# Patient Record
Sex: Female | Born: 1985 | Race: White | Hispanic: No | Marital: Single | Smoking: Current every day smoker
Health system: Southern US, Community
[De-identification: ages and names within clinical notes are randomized; demographics above are authoritative.]

## PROBLEM LIST (undated history)

## (undated) DIAGNOSIS — F32A Depression, unspecified: Secondary | ICD-10-CM

## (undated) DIAGNOSIS — I82409 Acute embolism and thrombosis of unspecified deep veins of unspecified lower extremity: Secondary | ICD-10-CM

## (undated) DIAGNOSIS — F329 Major depressive disorder, single episode, unspecified: Secondary | ICD-10-CM

## (undated) HISTORY — PX: OTHER SURGICAL HISTORY: SHX169

---

## 2017-12-22 ENCOUNTER — Encounter: Payer: Self-pay | Admitting: Emergency Medicine

## 2017-12-22 ENCOUNTER — Other Ambulatory Visit: Payer: Self-pay

## 2017-12-22 ENCOUNTER — Emergency Department
Admission: EM | Admit: 2017-12-22 | Discharge: 2017-12-22 | Disposition: A | Discharge status: Home / Self Care | Payer: Medicaid Other | Attending: Emergency Medicine | Admitting: Emergency Medicine

## 2017-12-22 DIAGNOSIS — K047 Periapical abscess without sinus: Secondary | ICD-10-CM | POA: Insufficient documentation

## 2017-12-22 DIAGNOSIS — F17210 Nicotine dependence, cigarettes, uncomplicated: Secondary | ICD-10-CM | POA: Insufficient documentation

## 2017-12-22 DIAGNOSIS — K029 Dental caries, unspecified: Secondary | ICD-10-CM | POA: Diagnosis not present

## 2017-12-22 DIAGNOSIS — K0889 Other specified disorders of teeth and supporting structures: Secondary | ICD-10-CM

## 2017-12-22 HISTORY — DX: Acute embolism and thrombosis of unspecified deep veins of unspecified lower extremity: I82.409

## 2017-12-22 MED ORDER — AMOXICILLIN 500 MG PO CAPS
1000.0000 mg | ORAL_CAPSULE | Freq: Three times a day (TID) | ORAL | Status: DC
  Administered 2017-12-22: 1000 mg via ORAL
  Filled 2017-12-22: qty 2

## 2017-12-22 MED ORDER — MAGIC MOUTHWASH W/LIDOCAINE: 5.0000 mL | Freq: Four times a day (QID) | ORAL | 0 refills | Status: DC

## 2017-12-22 MED ORDER — HYDROCODONE-ACETAMINOPHEN 5-325 MG PO TABS
1.0000 | ORAL_TABLET | Freq: Once | ORAL | Status: AC
  Administered 2017-12-22: 1 via ORAL
  Filled 2017-12-22: qty 1

## 2017-12-22 MED ORDER — AMOXICILLIN 875 MG PO TABS: 875.0000 mg | ORAL_TABLET | Freq: Two times a day (BID) | ORAL | 0 refills | Status: DC

## 2017-12-22 NOTE — ED Provider Notes (Signed)
[facility] Emergency Department Provider Note  ____________________________________________  Time seen: Approximately 11:20 PM  I have reviewed the triage vital signs and the nursing notes.   HISTORY  Chief Complaint Dental Pain    HPI Kerri Wood is a 32 y.o. female who presents the emergency department complaining of dental pain.  Patient reports that she has known bad dentition.  She reports that she has 1 tooth that has eroded into the gumline in the right lower dentition.  Patient is reporting dental pain from are erupting wisdom teeth, and for "teeth that have been draining pus for 4 months."  Patient denies difficulty breathing or swallowing, fevers or chills, nausea or vomiting.  Patient takes Suboxone on a regular basis and is also been supplementing with Tylenol, Motrin, aspirin.  No other complaints at this time.  Past Medical History:  Diagnosis Date  . DVT (deep venous thrombosis) (HCC)     There are no active problems to display for this patient.   History reviewed. No pertinent surgical history.  Prior to Admission medications   Medication Sig Start Date End Date Taking? Authorizing Provider  amoxicillin (AMOXIL) 875 MG tablet Take 1 tablet (875 mg total) by mouth 2 (two) times daily. 12/22/17   Eulalia Ellerman, Delorise Royals, PA-C  magic mouthwash w/lidocaine SOLN Take 5 mLs by mouth 4 (four) times daily. 12/22/17   Bretton Tandy, Delorise Royals, PA-C    Allergies Morphine and related  History reviewed. No pertinent family history.  Social History Social History   Tobacco Use  . Smoking status: Current Every Day Smoker    Packs/day: 0.50    Types: Cigarettes  . Smokeless tobacco: Never Used  Substance Use Topics  . Alcohol use: Yes  . Drug use: Never     Review of Systems  Constitutional: No fever/chills Eyes: No visual changes. No discharge ENT: Positive for multiple dental complaints Cardiovascular: no chest pain. Respiratory: no  cough. No SOB. Gastrointestinal: No abdominal pain.  No nausea, no vomiting.  No diarrhea.  No constipation. Musculoskeletal: Negative for musculoskeletal pain. Skin: Negative for rash, abrasions, lacerations, ecchymosis. Neurological: Negative for headaches, focal weakness or numbness. 10-point ROS otherwise negative.  ____________________________________________   PHYSICAL EXAM:  VITAL SIGNS: ED Triage Vitals  Enc Vitals Group     BP 12/22/17 2202 121/78     Pulse Rate 12/22/17 2202 94     Resp --      Temp 12/22/17 2202 98 F (36.7 C)     Temp Source 12/22/17 2202 Oral     SpO2 12/22/17 2202 99 %     Weight 12/22/17 2154 140 lb (63.5 kg)     Height 12/22/17 2154  (1.676 m)     Head Circumference --      Peak Flow --      Pain Score 12/22/17 2158 8     Pain Loc --      Pain Edu? --      Excl. in GC? --      Constitutional: Alert and oriented. Well appearing and in no acute distress. Eyes: Conjunctivae are normal. PERRL. EOMI. Head: Atraumatic. ENT:      Ears:       Nose: No congestion/rhinnorhea.      Mouth/Throat: Mucous membranes are moist.  Patient with multiple caries, gingival erosion, dental erosion to the gumline.  Patient does have an erupting wisdom tooth to the right lower dentition.  Patient's forefront teeth in the lower dentition with surrounding gingivitis.  Caries are appreciated to the teeth.  No significant dental erosion to the teeth.  No loose dentition.  No pustular drainage at this time. Neck: No stridor.   Hematological/Lymphatic/Immunilogical: No cervical lymphadenopathy. Cardiovascular: Normal rate, regular rhythm. Normal S1 and S2.  Good peripheral circulation. Respiratory: Normal respiratory effort without tachypnea or retractions. Lungs CTAB. Good air entry to the bases with no decreased or absent breath sounds. Musculoskeletal: Full range of motion to all extremities. No gross deformities appreciated. Neurologic:  Normal speech and  language. No gross focal neurologic deficits are appreciated.  Skin:  Skin is warm, dry and intact. No rash noted. Psychiatric: Mood and affect are normal. Speech and behavior are normal. Patient exhibits appropriate insight and judgement.   ____________________________________________   LABS (all labs ordered are listed, but only abnormal results are displayed)  Labs Reviewed - No data to display ____________________________________________  EKG   ____________________________________________  RADIOLOGY   No results found.  ____________________________________________    PROCEDURES  Procedure(s) performed:    Procedures    Medications  amoxicillin (AMOXIL) capsule 1,000 mg (1,000 mg Oral Given 12/22/17 2349)  HYDROcodone-acetaminophen (NORCO/VICODIN) 5-325 MG per tablet 1 tablet (1 tablet Oral Given 12/22/17 2349)     ____________________________________________   INITIAL IMPRESSION / ASSESSMENT AND PLAN / ED COURSE  Pertinent labs & imaging results that were available during my care of the patient were reviewed by me and considered in my medical decision making (see chart for details).  Review of the [place] CSRS was performed in accordance of the [facility] prior to dispensing any controlled drugs.     Patient's diagnosis is consistent with dental infection, dental erosions.  Patient presented with multiple teeth complaint.  No indication for labs or imaging at this time.  No indication of deep space infections.  Patient has an erupting wisdom tooth, patient will be discharged home with prescriptions for amoxicillin, Magic mouthwash. Patient is to follow up with dentist as needed or otherwise directed. Patient is given ED precautions to return to the ED for any worsening or new symptoms.     ____________________________________________  FINAL CLINICAL IMPRESSION(S) / ED DIAGNOSES  Final diagnoses:  Pain, dental  Dental caries  Dental infection      NEW  MEDICATIONS STARTED DURING THIS VISIT:  ED Discharge Orders        Ordered    amoxicillin (AMOXIL) 875 MG tablet  2 times daily     12/22/17 2329    magic mouthwash w/lidocaine SOLN  4 times daily    Note to Pharmacy:  Dispense in a 1/1/1 ratio. Use lidocaine, diphenhydramine, prednisolone   12/22/17 2329          This chart was dictated using voice recognition software/Dragon. Despite best efforts to proofread, errors can occur which can change the meaning. Any change was purely unintentional.    Racheal Patches, PA-C 12/23/17 0003    Myrna Blazer, MD 12/23/17 2227

## 2017-12-22 NOTE — Discharge Instructions (Signed)
OPTIONS FOR DENTAL FOLLOW UP CARE ° °[place] Department of Health and Human Services - Local Safety Net Dental Clinics °http://www.ncdhhs.gov/dph/oralhealth/services/safetynetclinics.htm °  °[facility] (336-562-3123) ° °[facility] [place] ([phone]) ° °[facility] ([phone] ext [phone]) ° °[facility] ([phone]) ° °[facility] ([phone]) °This clinic caters to the indigent population and is on a lottery system. °Location: °[facility], [address], [place] °Clinic Hours: °Wednesdays from 6pm - 9pm, patients seen by a lottery system. °For dates, call or go to [email or web address]/[facility]/[email or web address] °Services: °Cleanings, fillings and simple extractions. °Payment Options: °DENTAL WORK IS FREE OF CHARGE. Bring proof of income or support. °Best way to get seen: °Arrive at 5:15 pm - this is a lottery, NOT first come/first serve, so arriving earlier will not increase your chances of being seen. °  °  °[facility] °[phone] °Select option 1 for emergencies °  °Location: °[facility], [address], [place] °Clinic Hours: °No walk-ins accepted - call the day before to schedule an appointment. °Check in times are 9:30 am and 1:30 pm. °Services: °Simple extractions, temporary fillings, pulpectomy/pulp debridement, uncomplicated abscess drainage. °Payment Options: °PAYMENT IS DUE AT THE TIME OF SERVICE.  Fee is usually $100-200, additional surgical procedures (e.g. abscess drainage) may be extra. °Cash, checks, Visa/MasterCard accepted.  Can file Medicaid if patient is covered for dental - patient should call case worker to check. °No discount for [facility] patients. °Best way to get seen: °MUST call the day before and get onto the schedule. Can usually be seen the next 1-2 days. No walk-ins accepted. °  °  °[facility] °[phone] °   °Location: °[facility], [address], [place] °Clinic Hours: °M, W, Th, F 8am or 1:30pm, Tues 9a or 1:30 - first come/first served. °Services: °Simple extractions, temporary fillings, uncomplicated abscess drainage.  You do not need to be an [place] resident. °Payment Options: °PAYMENT IS DUE AT THE TIME OF SERVICE. °Dental insurance, otherwise sliding scale - bring proof of income or support. °Depending on income and treatment needed, cost is usually $50-200. °Best way to get seen: °Arrive early as it is first come/first served. °  °  °[facility] °[phone] °  °Location: °[address] °Clinic Hours: °Mon-Thu 8a-5p °Services: °Most basic dental services including extractions and fillings. °Payment Options: °PAYMENT IS DUE AT THE TIME OF SERVICE. °Sliding scale, up to 50% off - bring proof if income or support. °Medicaid with dental option accepted. °Best way to get seen: °Call to schedule an appointment, can usually be seen within 2 weeks OR they will try to see walk-ins - show up at 8a or 2p (you may have to wait). °  °  °[facility] °[phone] °[place] RESIDENTS ONLY °  °Location: °[facility], [address], [place], [place] [address] °Clinic Hours: By appointment only. °Monday - Thursday 8am-5pm, Friday 8am-12pm °Services: Cleanings, fillings, extractions. °Payment Options: °PAYMENT IS DUE AT THE TIME OF SERVICE. °Cash, Visa or MasterCard. Sliding scale - $30 minimum per service. °Best way to get seen: °Come in to office, complete packet and make an appointment - need proof of income °or support monies for each household member and proof of [place] residence. °Usually takes about a month to get in. °  °  °[facility] °[phone] °  °Location: °[address]   [place] °Clinic Hours: Walk-in Urgent Care Dental Services are offered Monday-Friday  mornings only. °The numbers of emergencies accepted daily is limited to the number of °providers available. °Maximum 15 - Mondays, Wednesdays & Thursdays °Maximum 10 - Tuesdays & Fridays °Services: °You do not need to be a [place] resident to be seen for a dental emergency. °Emergencies are defined as pain, swelling, abnormal bleeding, or dental trauma. Walkins will receive x-rays if needed. °NOTE: Dental cleaning is not an emergency. °Payment Options: °PAYMENT IS DUE AT THE TIME OF SERVICE. °Minimum co-pay is $40.00 for uninsured patients. °Minimum co-pay is $3.00 for Medicaid with dental coverage. °Dental Insurance is accepted and must be presented at time of visit. °Medicare does not cover dental. °Forms of payment: Cash, credit card, checks. °Best way to get seen: °If not previously registered with the clinic, walk-in dental registration begins at 7:15 am and is on a first come/first serve basis. °If previously registered with the clinic, call to make an appointment. °  °  °[facility] °[phone] °[place] RESIDENTS ONLY °  °Location: °[address], [place], [place] °Clinic Hours: °Mon-Thu 10a-2p °Services: Extractions only! °Payment Options: °FREE (donations accepted) - bring proof of income or support °Best way to get seen: °Call and schedule an appointment OR come at 8am on the 1st Monday of every month (except for holidays) when it is first come/first served. °  °  °[facility] °[phone] °  °Location: °[address],  °Clinic Hours: °Friday mornings °Services, Payment Options, Best way to get seen: °Call for info °

## 2017-12-22 NOTE — ED Triage Notes (Signed)
Pt reports bottom right toothache pain since yesterday; also having pain and drainage to her front 4 middle bottom teeth; pain to front teeth has been there "for months"; pt talking in complete coherent sentences;

## 2018-02-05 ENCOUNTER — Encounter: Payer: Self-pay | Admitting: Emergency Medicine

## 2018-02-05 ENCOUNTER — Other Ambulatory Visit: Payer: Self-pay

## 2018-02-05 ENCOUNTER — Emergency Department
Admission: EM | Admit: 2018-02-05 | Discharge: 2018-02-05 | Disposition: A | Discharge status: Home / Self Care | Payer: Medicaid Other | Attending: Emergency Medicine | Admitting: Emergency Medicine

## 2018-02-05 DIAGNOSIS — F17210 Nicotine dependence, cigarettes, uncomplicated: Secondary | ICD-10-CM | POA: Insufficient documentation

## 2018-02-05 DIAGNOSIS — J069 Acute upper respiratory infection, unspecified: Secondary | ICD-10-CM | POA: Insufficient documentation

## 2018-02-05 DIAGNOSIS — Z79899 Other long term (current) drug therapy: Secondary | ICD-10-CM | POA: Insufficient documentation

## 2018-02-05 DIAGNOSIS — R509 Fever, unspecified: Secondary | ICD-10-CM | POA: Diagnosis present

## 2018-02-05 MED ORDER — ACETAMINOPHEN 325 MG PO TABS
650.0000 mg | ORAL_TABLET | Freq: Once | ORAL | Status: AC
  Administered 2018-02-05: 650 mg via ORAL
  Filled 2018-02-05: qty 2

## 2018-02-05 MED ORDER — DIPHENHYDRAMINE HCL 25 MG PO CAPS
25.0000 mg | ORAL_CAPSULE | Freq: Once | ORAL | Status: AC
  Administered 2018-02-05: 25 mg via ORAL
  Filled 2018-02-05: qty 1

## 2018-02-05 MED ORDER — DIPHENHYDRAMINE HCL 25 MG PO CAPS: 25.0000 mg | ORAL_CAPSULE | Freq: Four times a day (QID) | ORAL | 0 refills | Status: DC | PRN

## 2018-02-05 NOTE — ED Notes (Signed)
See triage note  Presents with some congestion and fever  Denies any sore throat or cough  States fever at home was 100.4  Afebrile on arrival

## 2018-02-05 NOTE — ED Notes (Signed)
First Nurse Note:  Patient states she is [redacted] weeks pregnant.  Complaining of temp 100.4 at home and dizziness.  Had tooth extraction recently, holding right side of face.  Placed in WC.

## 2018-02-05 NOTE — Discharge Instructions (Signed)
Follow discharge care instructions.  Advised over-the-counter Benadryl and extra strength Tylenol.

## 2018-02-05 NOTE — ED Provider Notes (Signed)
[facility] Emergency Department Provider Note   ____________________________________________   First MD Initiated Contact with Patient 02/05/18 [phone]     (approximate)  I have reviewed the triage vital signs and the nursing notes.   HISTORY  Chief Complaint Fever and Headache    HPI Kerri Wood is a 32 y.o. female patient presents with complaint of fever nasal congestion.  Onset of complaint was last night.  Patient is [redacted] weeks gestation.  Patient has mild nausea but denies vomiting.   Past Medical History:  Diagnosis Date  . DVT (deep venous thrombosis) (HCC)     There are no active problems to display for this patient.   History reviewed. No pertinent surgical history.  Prior to Admission medications   Medication Sig Start Date End Date Taking? Authorizing Provider  amoxicillin (AMOXIL) 875 MG tablet Take 1 tablet (875 mg total) by mouth 2 (two) times daily. 12/22/17   Cuthriell, Delorise RoyalsJonathan D, PA-C  diphenhydrAMINE (BENADRYL ALLERGY) 25 mg capsule Take 1 capsule (25 mg total) by mouth every 6 (six) hours as needed. 02/05/18   Joni ReiningSmith, Nyshaun Standage K, PA-C  magic mouthwash w/lidocaine SOLN Take 5 mLs by mouth 4 (four) times daily. 12/22/17   Cuthriell, Delorise RoyalsJonathan D, PA-C    Allergies Morphine and related  No family history on file.  Social History Social History   Tobacco Use  . Smoking status: Current Every Day Smoker    Packs/day: 0.50    Types: Cigarettes  . Smokeless tobacco: Never Used  Substance Use Topics  . Alcohol use: Yes  . Drug use: Never    Review of Systems Constitutional: Subjective fever/chills. Eyes: No visual changes. ENT: No sore throat.  Nasal congestion Cardiovascular: Denies chest pain. Respiratory: Denies shortness of breath. Gastrointestinal: No abdominal pain.  No nausea, no vomiting.  No diarrhea.  No constipation. Genitourinary: Negative for dysuria. Musculoskeletal: Negative for back pain. Skin: Negative for  rash. Neurological: Positive for headaches, but denies focal weakness or numbness. Allergic/Immunilogical: Morphine  ____________________________________________   PHYSICAL EXAM:  VITAL SIGNS: ED Triage Vitals  Enc Vitals Group     BP 02/05/18 0854 122/72     Pulse Rate 02/05/18 0854 93     Resp --      Temp 02/05/18 0854 98.7 F (37.1 C)     Temp Source 02/05/18 0854 Oral     SpO2 02/05/18 0854 93 %     Weight --      Height --      Head Circumference --      Peak Flow --      Pain Score 02/05/18 0901 5     Pain Loc --      Pain Edu? --      Excl. in GC? --    Constitutional: Alert and oriented. Well appearing and in no acute distress. Nose: Edematous nasal turbinates clear rhinorrhea Mouth/Throat: Mucous membranes are moist.  Oropharynx non-erythematous.  Postnasal drainage. Neck: No stridor.  Hematological/Lymphatic/Immunilogical: No cervical lymphadenopathy. Cardiovascular: Normal rate, regular rhythm. Grossly normal heart sounds.  Good peripheral circulation. Respiratory: Normal respiratory effort.  No retractions. Lungs CTAB. Gastrointestinal: Soft and nontender. No distention. No abdominal bruits. No CVA tenderness. Musculoskeletal: No lower extremity tenderness nor edema.  No joint effusions. Neurologic:  Normal speech and language. No gross focal neurologic deficits are appreciated. No gait instability. Skin:  Skin is warm, dry and intact. No rash noted. Psychiatric: Mood and affect are normal. Speech and behavior are normal.  ____________________________________________   LABS (all labs ordered are listed, but only abnormal results are displayed)  Labs Reviewed - No data to display ____________________________________________  EKG   ____________________________________________  RADIOLOGY  ED MD interpretation:    Official radiology report(s): No results found.  ____________________________________________   PROCEDURES  Procedure(s) performed:  None  Procedures  Critical Care performed: No  ____________________________________________   INITIAL IMPRESSION / ASSESSMENT AND PLAN / ED COURSE  As part of my medical decision making, I reviewed the following data within the electronic MEDICAL RECORD NUMBER    Nasal congestion and headache secondary to viral respiratory infection.  Patient given discharge care instruction.  Patient advised take medication as directed.  Patient advised follow-up PCP.      ____________________________________________   FINAL CLINICAL IMPRESSION(S) / ED DIAGNOSES  Final diagnoses:  Upper respiratory tract infection, unspecified type     ED Discharge Orders        Ordered    diphenhydrAMINE (BENADRYL ALLERGY) 25 mg capsule  Every 6 hours PRN     02/05/18 0932       Note:  This document was prepared using Dragon voice recognition software and may include unintentional dictation errors.    Joni Reining, PA-C 02/05/18 1610    Emily Filbert, MD 02/05/18 [phone]

## 2018-02-05 NOTE — ED Triage Notes (Signed)
Fever, congestion since last night. Also states [redacted] weeks pregnant. Appears in NAD.

## 2018-02-11 ENCOUNTER — Other Ambulatory Visit: Payer: Self-pay | Admitting: Obstetrics and Gynecology

## 2018-02-11 DIAGNOSIS — Z369 Encounter for antenatal screening, unspecified: Secondary | ICD-10-CM

## 2018-02-11 LAB — OB RESULTS CONSOLE VARICELLA ZOSTER ANTIBODY, IGG: Varicella: IMMUNE

## 2018-02-27 ENCOUNTER — Ambulatory Visit (HOSPITAL_BASED_OUTPATIENT_CLINIC_OR_DEPARTMENT_OTHER)
Admission: RE | Admit: 2018-02-27 | Discharge: 2018-02-27 | Disposition: A | Discharge status: Home / Self Care | Payer: Medicaid Other | Source: Ambulatory Visit | Attending: Maternal & Fetal Medicine | Admitting: Maternal & Fetal Medicine

## 2018-02-27 ENCOUNTER — Encounter: Payer: Self-pay | Admitting: *Deleted

## 2018-02-27 ENCOUNTER — Ambulatory Visit
Admission: RE | Admit: 2018-02-27 | Discharge: 2018-02-27 | Disposition: A | Discharge status: Home / Self Care | Payer: Medicaid Other | Source: Ambulatory Visit | Attending: Maternal & Fetal Medicine | Admitting: Maternal & Fetal Medicine

## 2018-02-27 VITALS — BP 129/71 | HR 89 | Temp 98.6°F | Resp 18 | Wt 135.6 lb

## 2018-02-27 DIAGNOSIS — Z810 Family history of intellectual disabilities: Secondary | ICD-10-CM | POA: Diagnosis not present

## 2018-02-27 DIAGNOSIS — Z369 Encounter for antenatal screening, unspecified: Secondary | ICD-10-CM | POA: Insufficient documentation

## 2018-02-27 DIAGNOSIS — Z3A13 13 weeks gestation of pregnancy: Secondary | ICD-10-CM | POA: Insufficient documentation

## 2018-02-27 NOTE — Progress Notes (Addendum)
Referring physician:  [facility]  Length of Consultation: 40 minutes   Kerri Wood  was referred to [facility] for genetic counseling to review prenatal screening and testing options as well as the family history of intellectual disabilities in her brother.  This note summarizes the information we discussed.    We offered the following routine screening tests for this pregnancy:  First trimester screening, which includes nuchal translucency ultrasound screen and first trimester maternal serum marker screening.  The nuchal translucency has approximately an 80% detection rate for Down syndrome and can be positive for other chromosome abnormalities as well as congenital heart defects.  When combined with a maternal serum marker screening, the detection rate is up to 90% for Down syndrome and up to 97% for trisomy 18.     Maternal serum marker screening, a blood test that measures pregnancy proteins, can provide risk assessments for Down syndrome, trisomy 18, and open neural tube defects (spina bifida, anencephaly). Because it does not directly examine the fetus, it cannot positively diagnose or rule out these problems.  Targeted ultrasound uses high frequency sound waves to create an image of the developing fetus.  An ultrasound is often recommended as a routine means of evaluating the pregnancy.  It is also used to screen for fetal anatomy problems (for example, a heart defect) that might be suggestive of a chromosomal or other abnormality.   Should these screening tests indicate an increased concern, then the following additional testing options would be offered:  The chorionic villus sampling procedure is available for first trimester chromosome analysis.  This involves the withdrawal of a small amount of chorionic villi (tissue from the developing placenta).  Risk of pregnancy loss is estimated to be approximately 1 in 200 to 1 in 100 (0.5 to 1%).  There  is approximately a 1% (1 in 100) chance that the CVS chromosome results will be unclear.  Chorionic villi cannot be tested for neural tube defects.     Amniocentesis involves the removal of a small amount of amniotic fluid from the sac surrounding the fetus with the use of a thin needle inserted through the maternal abdomen and uterus.  Ultrasound guidance is used throughout the procedure.  Fetal cells from amniotic fluid are directly evaluated and > 99.5% of chromosome problems and > 98% of open neural tube defects can be detected. This procedure is generally performed after the 15th week of pregnancy.  The main risks to this procedure include complications leading to miscarriage in less than 1 in 200 cases (0.5%).  As another option for information if the pregnancy is suspected to be an an increased chance for certain chromosome conditions, we also reviewed the availability of cell free fetal DNA testing from maternal blood to determine whether or not the baby may have either Down syndrome, trisomy 70, or trisomy 77.  This test utilizes a maternal blood sample and DNA sequencing technology to isolate circulating cell free fetal DNA from maternal plasma.  The fetal DNA can then be analyzed for DNA sequences that are derived from the three most common chromosomes involved in aneuploidy, chromosomes 13, 18, and 21.  If the overall amount of DNA is greater than the expected level for any of these chromosomes, aneuploidy is suspected.  While we do not consider it a replacement for invasive testing and karyotype analysis, a negative result from this testing would be reassuring, though not a guarantee of a normal chromosome complement for the  baby.  An abnormal result is certainly suggestive of an abnormal chromosome complement, though we would still recommend CVS or amniocentesis to confirm any findings from this testing.  Cystic Fibrosis and Spinal Muscular Atrophy (SMA) screening were also discussed with the  patient. Both conditions are recessive, which means that both parents must be carriers in order to have a child with the disease.  Cystic fibrosis (CF) is one of the most common genetic conditions in persons of Caucasian ancestry.  This condition occurs in approximately 1 in 2,500 Caucasian persons and results in thickened secretions in the lungs, digestive, and reproductive systems.  For a baby to be at risk for having CF, both of the parents must be carriers for this condition.  Approximately 1 in 6925 Caucasian persons is a carrier for CF.  Current carrier testing looks for the most common mutations in the gene for CF and can detect approximately 90% of carriers in the Caucasian population.  This means that the carrier screening can greatly reduce, but cannot eliminate, the chance for an individual to have a child with CF.  If an individual is found to be a carrier for CF, then carrier testing would be available for the partner. As part of [place] newborn screening profile, all babies born in the state of [place] will have a two-tier screening process.  Specimens are first tested to determine the concentration of immunoreactive trypsinogen (IRT).  The top 5% of specimens with the highest IRT values then undergo DNA testing using a panel of over 40 common CF mutations. SMA is a neurodegenerative disorder that leads to atrophy of skeletal muscle and overall weakness.  This condition is also more prevalent in the Caucasian population, with 1 in 40-1 in 60 persons being a carrier and 1 in 6,000-1 in 10,000 children being affected.  There are multiple forms of the disease, with some causing death in infancy to other forms with survival into adulthood.  The genetics of SMA is complex, but carrier screening can detect up to 95% of carriers in the Caucasian population.  Similar to CF, a negative result can greatly reduce, but cannot eliminate, the chance to have a child with SMA.  We obtained a detailed  family history and pregnancy history.  [name] stated that her brother has developmental delays and mental health concerns.  He is now 30 years old, but has had these delays throughout his lifetime.  He lives alone but does have assistance with paying bills, etc.  He has no known birth defects or medical conditions apart from his mental health diagnoses.  The patient was not sure if he had has any evaluations with genetics to determine a cause for his condition, though she did report that there was prenatal substance abuse.  We reviewed that there may be many reasons for intellectual disabilities, some genetic and some environmental.  Without a known cause for the condition, we cannot accurately assess the chance for other family members to be born with a similar condition.  We are happy to review medical records on this family member if they can be obtained.  If his delays are related to prenatal exposure, then we would not expect this pregnancy to be at risk in the absence of a similar exposure. We did also offer the option of Fragile X carrier screening given the history of a female relative with developmental delays. [name] also reported a personal history of a blood clot, which Dr. Fayrene FearingJames reviewed with her.  (  See the ultrasound report for her comments).  Lastly, there was a history of breast cancer in her maternal grandmother in her 71s and ovarian cancer in a maternal cousin (it is unclear how this cousin is related) prior to age 51.  The patient had been contacted by her mother suggesting evaluation for these cancers, but it was not clear if there was a positive genetic test in the family or if this was just precautionary.  We are happy to put Ms. Hillman in contact with a Dentist who specializes in hereditary cancers if desired or if more is learned about this history, but she declined that contact information today.  The remainder of the family history was reported to be unremarkable for  birth defects, intellectual delays, recurrent pregnancy loss or known chromosome abnormalities.  Ms. Leisner stated that this is her second pregnancy, the first with her current partner.  She has a healthy 2 year old son.  She reported no complications in this pregnancy other than a dental abscess, for which she was treated.  She is prescribed Suboxone, which we reviewed in detail.  This medication has not been shown to increased the risk for birth defects, though it is associated with potential for neonatal abstinence syndrome and we would therefore suggest informing the nursery of this exposure.  She reported smoking 1/2 pack of cigarettes per day.  As we discussed, smoking during pregnancy has been associated with low birth weight, premature delivery and pregnancy loss.  For this reason, we suggest that she cut back or avoid smoking for the remainder of the pregnancy.  After consideration of the options, Ms. Coca elected to proceed with first trimester screening.  She declined carrier screening for Fragile X syndrome, CF and SMA.  An ultrasound was performed at the time of the visit.  The gestational age was consistent with 13 weeks.  Fetal anatomy could not be assessed due to early gestational age.  Please refer to the ultrasound report for details of that study.  Ms. Ziolkowski was encouraged to call with questions or concerns.  We can be contacted at [phone].  Labs ordered: first trimester screening  Cherly Anderson, MS, CGC  I was immediately available and supervising. Argentina Ponder, MD [facility]

## 2018-03-06 ENCOUNTER — Telehealth: Payer: Self-pay | Admitting: Genetics

## 2018-03-06 NOTE — Telephone Encounter (Signed)
  The results of the First Trimester Nuchal Translucency and Biochemical Screening performed on 03/04/18, are now available.  These results showed an increased risk for Down syndrome.  Specifically, the risk for Down syndrome is estimated to be 1 in 172 (the prior risk based upon age alone was 1 in 50635).   The risk for Trisomy 13/18 is now estimated to be 1 in 10,000.  As we discussed when we called with these results, if more definitive information is desired, we would offer amniocentesis.  Because we do not yet know the effectiveness of combined first and second trimester screening, we do not recommend a maternal serum screen to assess the chance for chromosome conditions.  However, if screening for neural tube defects is desired, maternal serum screening for AFP only can be performed between 15 and [redacted] weeks gestation.    We briefly introduced the option of additional screening and/or diagnostic testing.  The patient is interested in meeting with the Genetic Counselor after her ultrasound on 03/13/18 to review these options.    The patient was encouraged to contact us with any questions.  We may be reached at [phone].

## 2018-03-13 ENCOUNTER — Ambulatory Visit
Admission: RE | Admit: 2018-03-13 | Discharge: 2018-03-13 | Disposition: A | Discharge status: Home / Self Care | Payer: Medicaid Other | Source: Ambulatory Visit | Attending: Maternal & Fetal Medicine | Admitting: Maternal & Fetal Medicine

## 2018-03-13 ENCOUNTER — Ambulatory Visit (HOSPITAL_BASED_OUTPATIENT_CLINIC_OR_DEPARTMENT_OTHER)
Admission: RE | Admit: 2018-03-13 | Discharge: 2018-03-13 | Disposition: A | Discharge status: Home / Self Care | Payer: Medicaid Other | Source: Ambulatory Visit | Attending: Maternal & Fetal Medicine | Admitting: Maternal & Fetal Medicine

## 2018-03-13 ENCOUNTER — Ambulatory Visit: Payer: Medicaid Other

## 2018-03-13 VITALS — BP 108/80 | HR 78 | Temp 98.1°F | Resp 18 | Wt 135.6 lb

## 2018-03-13 DIAGNOSIS — F17210 Nicotine dependence, cigarettes, uncomplicated: Secondary | ICD-10-CM | POA: Diagnosis not present

## 2018-03-13 DIAGNOSIS — Z885 Allergy status to narcotic agent status: Secondary | ICD-10-CM | POA: Diagnosis not present

## 2018-03-13 DIAGNOSIS — Z3A15 15 weeks gestation of pregnancy: Secondary | ICD-10-CM

## 2018-03-13 DIAGNOSIS — Z86718 Personal history of other venous thrombosis and embolism: Secondary | ICD-10-CM | POA: Diagnosis present

## 2018-03-13 DIAGNOSIS — O289 Unspecified abnormal findings on antenatal screening of mother: Secondary | ICD-10-CM | POA: Diagnosis not present

## 2018-03-13 NOTE — Progress Notes (Signed)
[name] and her partner were seen today in clinic to review the results of the first trimester screening drawn on 02/27/2018.  The first trimester screen results for [name] revealed protein levels and a nuchal translucency measurement that increased the chance of Down syndrome in the pregnancy.  Before screening, the age-related chance of Down syndrome in the pregnancy was 1 in 635.  Given the first trimester screen results, the chance was estimated to be 1 in 172.  This means that the chance the baby does not have Down syndrome is greater than 99 percent.  The chance for trisomy 13 and 18 was 1 in>10,000, which is less than the cutoff risk of 1 in 150.    We offered the following options: Circulating cell free fetal DNA testing (MaterniT21 testing) is available to provide additional risk assessment for Down syndrome, trisomy 2713, trisomy 6718 or sex chromosome conditions. This test utilizes a maternal blood sample and DNA sequencing technology to isolate circulating cell free fetal DNA from maternal plasma. The fetal DNA can then be analyzed for DNA sequences that are derived from the three most common chromosomes involved in aneuploidy, chromosomes 13, 18, and 21. If the overall amount of DNA is greater than the expected level for any of these chromosomes, aneuploidy is suspected. This testing is able to provide another means of determining the chance for one of these common chromosome conditions, without requiring an invasive procedure and traditional karyotype analysis. We explained that while we do not consider it a replacement for invasive testing and karyotype analysis, a negative result from this testing would be reassuring, though not a guarantee of a normal chromosome complement for the baby. An abnormal result is certainly suggestive of an abnormal chromosome complement, though we would still recommend amniocentesis to confirm any findings from this testing.  The detection rate is >99% for Down  syndrome and trisomy 18 and ?>91% for trisomy 5113.  Targeted ultrasound uses high frequency sound waves to create an image of the developing fetus.  An ultrasound is often recommended as a routine means of evaluating the pregnancy.  It is also used to screen for fetal anatomy problems (for example, a heart defect) that might be suggestive of a chromosomal or other abnormality. It is estimated that 50% of babies with Down syndrome would show differences on this ultrasound at 18-[redacted] weeks gestation.  Amniocentesis involves the removal of a small amount of amniotic fluid from the sac surrounding the fetus with the use of a thin needle inserted through the maternal abdomen and uterus.  Ultrasound guidance is used throughout the procedure.  Fetal cells from amniotic fluid are directly evaluated and > 99.5% of chromosome problems and > 98% of open neural tube defects can be detected. This procedure is generally performed after the 15th week of pregnancy.  The main risks to this procedure include complications leading to miscarriage in less than 1 in 200 cases (0.5%).  After consideration of these options, [name] elected to proceed with MaterniT21 PLUS with SCA testing. Results should be available within 2 weeks.  She is also scheduled to return for a detailed ultrasound in the second trimester.  Of note, the PAPP-A level was at the 2.5 percentile.  PAPP-A results at or below the 5th percentile have been associated with and increased chance for adverse obstetrical outcomes, including preeclampsia.  For this reason, a third trimester ultrasound for growth should also be considered.    Tests ordered:  MaterniT21 PLUS with SCA  Wilburt Finlay, MS, CGC

## 2018-03-13 NOTE — Progress Notes (Signed)
Consult Note   Date of Visit:  03/13/2018  Referring Provider: [facility]  Reason for Consult: History of blood clot.  First trimester screen with increased risk for Down syndrome.  History of Present Illness: Kerri Wood is a 32 y.o. female gravida 2 para 1001 at 54w2dgestation who [facility] a [facility]    [facility].  On ultrasound, she was found to have an occlusive clot in the greater saphenous vein from the level of the mid thigh inferiorly to just below the knee. She was treated with warm compresses and NSAIDs.   Past Ob History: OB History  Gravida Para Term Preterm AB Living  _0 0 0 1  SAB TAB Ectopic Molar Multiple Live Births  0 0 0 0 0 1    # Outcome Date GA Lbr Len/2nd Weight Sex Delivery Anes PTL Lv  2 Current           1 Term 11/24/05 475w0d3.572 kg (7 lb 14 oz) M Vag-Spont   LIV     Name: Kerri Wood   Past Gyn History: 12/25/2017 - Pap negative/HPV negative  Past Medical History:  Past Medical History:  Diagnosis Date  . Anemia, unspecified   . 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 Migraine   . Substance abuse (CMS-HCC)     Past Surgical History:  Past Surgical History:  Procedure Laterality Date  . REMOVAL FOREIGN BODY ARM    . TONSILLECTOMY      Family History:  Family History  Problem Relation Age of Onset  . Mental illness Mother   . Mental illness Brother   . Breast cancer Maternal Grandmother   . Colon cancer Paternal Grandmother   . Lung cancer Paternal Grandfather   . Throat cancer Paternal Grandfather   . Ovarian cancer Cousin     Social History:  Social History   Tobacco Use  . Smoking status: Current Every Day Smoker    Packs/day: 0.50     Years: 10.00    Pack years: 5.00    Types: Cigarettes  . Smokeless tobacco: Never Used  Substance Use Topics  . Alcohol use: No     Allergies:  Allergies  Allergen Reactions  . Morphine Hives     Medications:  PNV Low dose ASA 81 mg per day Buprenorphine 8 mg tid  Review of Systems: No shortness of breath or chest pain, no pain or swelling in her extremities.  The rest of a 12-system ROS is negative.     OBJECTIVE: BP 108/80 (BP Location: Right Arm)   Pulse 78   Temp 98.1 F (36.7 C) (Oral)   Resp 18   Wt 135 lb 9.6 oz (61.5 kg)   LMP 11/25/2017 (Exact Date)   SpO2 100%   BMI 21.89 kg/m   First trimester screening 02/27/2018 - normal first trimester anatomy, increased risk for Down syndrome  ASSESSMENT/RECOMMENDATIONS: 1. History of superficial vein thrombosis -- The patient was counseled that her history increases her risk of developing DVT or PE by 5-fold. -- The patient was also counseled that pregnancy increases the risk of thrombosis 4- to 5-fold and the postpartum period increases the risk 20- to 80-fold.   --  Given these increased risks of thrombosis, I have recommended low dose ASA during pregnancy and throughout the postpartum period and would recommend thromboprophylaxis with enoxaparin 40 mg qd during the postpartum period starting 12 hours after a vaginal delivery or 24 hours after a cesarean delivery.  I would recommend pneumatic compression devices during active labor until enoxaparin is started. The patient was given preliminary instructions on enoxaparin self-administration today. --  If the patient were to have a high risk thrombophilia, I would recommend enoxaparin during pregnancy as well as postpartum.  We will obtain an inherited thrombophilia work-up today.    --  The patient was also counseled that enoxaparin does not cross the placenta and is not orally bioavailable so it is safe during both pregnancy and breastfeeding. --   The patient was  counseled that we would recommend against estrogen containing contraceptives in the future.   2. 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 --  The nursery should be apprised. 3. Smoking 1/2 PPD - trying to quit --  In addition to the fetal consequences, the patient was counseled about the maternal risks of smoking which double the risk of thrombosis and increase the risk of infection, particularly respiratory infection. 4. Increased risk for Down syndrome on first trimester screen --  The patient and her partner met with our genetic counselor, Donette Larry, today and elected to proceed with cell free fetal DNA testing.  I personally performed the service.  (TP)  I spent 40 minutes in consultation more than half of which was spent counseling the patient and coordinating her care.   Erasmo Score, MD

## 2018-03-14 LAB — PROTEIN S ACTIVITY: Protein S Activity: 58 % — ABNORMAL LOW (ref 63–140)

## 2018-03-14 LAB — PROTEIN C ACTIVITY: Protein C Activity: 128 % (ref 73–180)

## 2018-03-14 LAB — BETA-2-GLYCOPROTEIN I ABS, IGG/M/A: Beta-2-Glycoprotein I IgA: <9 GPI IgA units (ref 0–25)

## 2018-03-15 LAB — LUPUS ANTICOAGULANT PANEL
DRVVT: 29.5 s (ref 0.0–47.0)
PTT Lupus Anticoagulant: 68.2 s — ABNORMAL HIGH (ref 0.0–51.9)

## 2018-03-15 LAB — CARDIOLIPIN ANTIBODIES, IGG, IGM, IGA
Anticardiolipin IgA: <9 APL U/mL (ref 0–11)
Anticardiolipin IgM: <9 MPL U/mL (ref 0–12)

## 2018-03-15 LAB — ANTITHROMBIN III: AntiThromb III Func: 98 % (ref 75–120)

## 2018-03-15 LAB — PTT-LA MIX: PTT-LA MIX: 75.2 s — AB (ref 0.0–48.9)

## 2018-03-15 LAB — HEXAGONAL PHASE PHOSPHOLIPID: HEXAGONAL PHASE PHOSPHOLIPID: 14 s — AB (ref 0–11)

## 2018-03-17 LAB — MATERNIT21 PLUS CORE+SCA
Chromosome 13: NEGATIVE
Chromosome 18: NEGATIVE
Chromosome 21: NEGATIVE
Y Chromosome: DETECTED

## 2018-03-17 LAB — FACTOR 5 LEIDEN

## 2018-03-19 LAB — PROTHROMBIN GENE MUTATION

## 2018-03-20 ENCOUNTER — Telehealth: Payer: Self-pay | Admitting: Obstetrics and Gynecology

## 2018-03-20 NOTE — Telephone Encounter (Signed)
The patient was informed of the results of her recent MaterniT21 testing which yielded NEGATIVE results.  The patient's specimen showed DNA consistent with two copies of chromosomes 21, 18 and 13.  The sensitivity for trisomy 21, trisomy 18 and trisomy 13 using this testing are reported as 99.1%, 99.9% and 91.7% respectively.  Thus, while the results of this testing are highly accurate, they are not considered diagnostic at this time.  Should more definitive information be desired, the patient may still consider amniocentesis.   As requested to know by the patient, sex chromosome analysis was included for this sample.  Results are consistent with a female fetus (Y chromosome material was detected). This is predicted with >99% accuracy.  A maternal serum AFP only should be considered if screening for neural tube defects is desired.  We may be reached at [phone] with any questions or concerns.   Joshlynn Alfonzo F. Dalary Hollar, MS, CGC   

## 2018-03-24 ENCOUNTER — Other Ambulatory Visit: Payer: Self-pay | Admitting: Obstetrics and Gynecology

## 2018-03-24 ENCOUNTER — Telehealth: Payer: Self-pay | Admitting: *Deleted

## 2018-03-24 ENCOUNTER — Other Ambulatory Visit: Payer: Self-pay | Admitting: *Deleted

## 2018-03-24 DIAGNOSIS — D6851 Activated protein C resistance: Secondary | ICD-10-CM | POA: Insufficient documentation

## 2018-03-24 DIAGNOSIS — I809 Phlebitis and thrombophlebitis of unspecified site: Secondary | ICD-10-CM | POA: Insufficient documentation

## 2018-03-24 DIAGNOSIS — D6862 Lupus anticoagulant syndrome: Secondary | ICD-10-CM | POA: Insufficient documentation

## 2018-03-24 DIAGNOSIS — I8000 Phlebitis and thrombophlebitis of superficial vessels of unspecified lower extremity: Secondary | ICD-10-CM

## 2018-03-24 DIAGNOSIS — O99111 Other diseases of the blood and blood-forming organs and certain disorders involving the immune mechanism complicating pregnancy, first trimester: Secondary | ICD-10-CM

## 2018-03-24 MED ORDER — ENOXAPARIN SODIUM 40 MG/0.4ML ~~LOC~~ SOLN: 40.0000 mg | SUBCUTANEOUS | 3 refills | Status: DC

## 2018-03-24 NOTE — Progress Notes (Unsigned)
Pt in early second trimester  H/o saphenous vein thrombosis  Dr Fayrene Fearingjames obtained labs And started aspirin  Positive LAC  FVL heterozygote  I requested she start lovenox 40 daily Order placed  RN will call pt in for next appointment to teach and discuss  Jimmey RalphLivingston, Avin Gibbons, MD

## 2018-03-24 NOTE — Telephone Encounter (Signed)
Called patient about need to start lovenox injections per MD.  Unable to contact patient.  Left pt. Message to notify of appointment scheduled [facility] August [facility]th [facility]

## 2018-03-27 ENCOUNTER — Ambulatory Visit: Payer: Medicaid Other

## 2018-03-31 ENCOUNTER — Other Ambulatory Visit: Payer: Self-pay | Admitting: Maternal and Fetal Medicine

## 2018-03-31 DIAGNOSIS — Z3689 Encounter for other specified antenatal screening: Principal | ICD-10-CM

## 2018-03-31 DIAGNOSIS — O30009 Twin pregnancy, unspecified number of placenta and unspecified number of amniotic sacs, unspecified trimester: Secondary | ICD-10-CM

## 2018-04-03 ENCOUNTER — Other Ambulatory Visit: Payer: Self-pay | Admitting: Maternal and Fetal Medicine

## 2018-04-03 ENCOUNTER — Ambulatory Visit (HOSPITAL_BASED_OUTPATIENT_CLINIC_OR_DEPARTMENT_OTHER)
Admission: RE | Admit: 2018-04-03 | Discharge: 2018-04-03 | Disposition: A | Discharge status: Home / Self Care | Payer: Medicaid Other | Source: Ambulatory Visit | Attending: Maternal and Fetal Medicine | Admitting: Maternal and Fetal Medicine

## 2018-04-03 ENCOUNTER — Ambulatory Visit
Admission: RE | Admit: 2018-04-03 | Discharge: 2018-04-03 | Disposition: A | Discharge status: Home / Self Care | Payer: Medicaid Other | Source: Ambulatory Visit | Attending: Maternal and Fetal Medicine | Admitting: Maternal and Fetal Medicine

## 2018-04-03 VITALS — BP 120/63 | HR 87 | Temp 98.3°F | Resp 18 | Wt 137.2 lb

## 2018-04-03 DIAGNOSIS — O30009 Twin pregnancy, unspecified number of placenta and unspecified number of amniotic sacs, unspecified trimester: Secondary | ICD-10-CM

## 2018-04-03 DIAGNOSIS — O99112 Other diseases of the blood and blood-forming organs and certain disorders involving the immune mechanism complicating pregnancy, second trimester: Secondary | ICD-10-CM | POA: Diagnosis not present

## 2018-04-03 DIAGNOSIS — Z3689 Encounter for other specified antenatal screening: Secondary | ICD-10-CM | POA: Diagnosis not present

## 2018-04-03 DIAGNOSIS — D689 Coagulation defect, unspecified: Secondary | ICD-10-CM | POA: Insufficient documentation

## 2018-04-03 DIAGNOSIS — I8000 Phlebitis and thrombophlebitis of superficial vessels of unspecified lower extremity: Secondary | ICD-10-CM

## 2018-04-03 DIAGNOSIS — O99111 Other diseases of the blood and blood-forming organs and certain disorders involving the immune mechanism complicating pregnancy, first trimester: Principal | ICD-10-CM

## 2018-04-03 DIAGNOSIS — Z3A18 18 weeks gestation of pregnancy: Secondary | ICD-10-CM | POA: Diagnosis not present

## 2018-04-03 DIAGNOSIS — D6851 Activated protein C resistance: Secondary | ICD-10-CM

## 2018-04-03 DIAGNOSIS — D6862 Lupus anticoagulant syndrome: Secondary | ICD-10-CM

## 2018-04-03 DIAGNOSIS — Z86718 Personal history of other venous thrombosis and embolism: Secondary | ICD-10-CM

## 2018-04-03 MED ORDER — ENOXAPARIN SODIUM 40 MG/0.4ML ~~LOC~~ SOLN: 1.0000 mg/kg | Freq: Two times a day (BID) | SUBCUTANEOUS | 3 refills | Status: DC

## 2018-04-03 NOTE — Progress Notes (Unsigned)
[facility] Consultation   Chief Complaint: Follow-up for lab results review  HPI: [name] is a 32 y.o. G2P0010 at 8354w3d here for follow-up consultation. She was seen by Dr Fayrene FearingJames for a hx of superficial thrombophlebitis and a thrombophilia eval was sent. Today these results were reviewed. She has a positive LAC (repeat to be done for confirmation after pregnancy) and a FVL heterozygote state.   Past Medical History: Patient  has a past medical history of DVT (deep venous thrombosis) (HCC).  Past Surgical History: She  has no past surgical history on file.  Obstetric History:  OB History    Gravida  2   Para      Term      Preterm      AB      Living  1     SAB      TAB      Ectopic      Multiple      Live Births             Medications: She has a current medication list which includes the following prescription(s): amoxicillin, buprenorphine-naloxone, diphenhydramine, enoxaparin, magic mouthwash w/lidocaine, and prenatal multivitamin.  Allergies: Patient is allergic to morphine and related.  Social History: Patient  reports that she has been smoking cigarettes. She has been smoking about 0.25 packs per day. She has never used smokeless tobacco. She reports that she drank alcohol. She reports that she does not use drugs.  Family History: family history includes Cancer in her maternal grandmother and paternal grandmother; Depression in her brother and mother.  Review of Systems A full 12 point review of systems was negative or as noted in the History of Present Illness.   Asessement: 1. 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 Superficial thrombophlebitis of lower extremity, unspecified laterality     Plan: We discussed the fact the LAC is a high risk thrombophilia. We discussed that the usual recommendation is to have 2 tests positive separated by 3 months.  However given that she is pregnant and this is also a high risk condition that we would err on the side of treatment during pregnancy and postpartum with a plan for follow-up testing after pregnancy. Today I recommend the following: 1. I ordered lovenox 40 mg BID to her pharmacy. This should continue throughout the pregnancy along with bASA. She received lovenox teaching at her last visit.  2. I ordered a CBC in 1-2 weeks that should be done in your office to evaluate for evidence of thrombocytopenia. Please call MFM if abnormal. 3. We recommend continuation of lovenox for 6 weeks postpartum. 4. Consider induction of labor at 39 weeks with stopping of lovenox prophylaxis the day prior to ensure the patient may receive regional anesthesia. Regional anesthesia may generally be used 12-24 hours after last dose of lovenox. 5. Recommend growth scans in the third trimester monthly 6. Consider NSTs after [redacted] weeks gestation   Total time spent with the patient was 30 minutes with greater than 50% spent in counseling and coordination of care. We appreciate this interesting consult and will be happy to be involved in the ongoing care of [name] in anyway her obstetricians desire.  Artemio AlyBrenna Jamie Hafford MD [facility]aternal-Fetal Medicine [facility]

## 2018-04-21 ENCOUNTER — Other Ambulatory Visit: Payer: Self-pay | Admitting: Obstetrics and Gynecology

## 2018-04-21 ENCOUNTER — Other Ambulatory Visit: Payer: Self-pay

## 2018-04-21 DIAGNOSIS — IMO0002 Reserved for concepts with insufficient information to code with codable children: Secondary | ICD-10-CM

## 2018-04-21 DIAGNOSIS — D6862 Lupus anticoagulant syndrome: Secondary | ICD-10-CM

## 2018-04-21 DIAGNOSIS — Z0489 Encounter for examination and observation for other specified reasons: Secondary | ICD-10-CM

## 2018-04-21 DIAGNOSIS — O99111 Other diseases of the blood and blood-forming organs and certain disorders involving the immune mechanism complicating pregnancy, first trimester: Principal | ICD-10-CM

## 2018-04-24 ENCOUNTER — Ambulatory Visit
Admission: RE | Admit: 2018-04-24 | Discharge: 2018-04-24 | Disposition: A | Discharge status: Home / Self Care | Payer: Medicaid Other | Source: Ambulatory Visit | Attending: Obstetrics & Gynecology | Admitting: Obstetrics & Gynecology

## 2018-04-24 DIAGNOSIS — Z3A21 21 weeks gestation of pregnancy: Secondary | ICD-10-CM | POA: Insufficient documentation

## 2018-04-24 DIAGNOSIS — Z0489 Encounter for examination and observation for other specified reasons: Secondary | ICD-10-CM | POA: Insufficient documentation

## 2018-04-24 DIAGNOSIS — IMO0002 Reserved for concepts with insufficient information to code with codable children: Secondary | ICD-10-CM

## 2018-06-09 ENCOUNTER — Other Ambulatory Visit: Payer: Self-pay

## 2018-06-09 DIAGNOSIS — D6862 Lupus anticoagulant syndrome: Secondary | ICD-10-CM

## 2018-06-09 DIAGNOSIS — O99111 Other diseases of the blood and blood-forming organs and certain disorders involving the immune mechanism complicating pregnancy, first trimester: Principal | ICD-10-CM

## 2018-06-12 ENCOUNTER — Ambulatory Visit
Admission: RE | Admit: 2018-06-12 | Discharge: 2018-06-12 | Disposition: A | Discharge status: Home / Self Care | Payer: Medicaid Other | Source: Ambulatory Visit | Attending: Maternal & Fetal Medicine | Admitting: Maternal & Fetal Medicine

## 2018-06-12 DIAGNOSIS — O99111 Other diseases of the blood and blood-forming organs and certain disorders involving the immune mechanism complicating pregnancy, first trimester: Secondary | ICD-10-CM | POA: Insufficient documentation

## 2018-06-12 DIAGNOSIS — D6862 Lupus anticoagulant syndrome: Secondary | ICD-10-CM

## 2018-06-12 DIAGNOSIS — Z3A28 28 weeks gestation of pregnancy: Secondary | ICD-10-CM | POA: Insufficient documentation

## 2018-08-13 NOTE — L&D Delivery Note (Signed)
Delivery Note  Date of delivery: 08/28/2018 Estimated Date of Delivery: 09/02/18 Patient's last menstrual period was 11/26/2017. EGA: [redacted]w[redacted]d  First Stage: Induction: misoprostol Augmentation: pitocin, AROM Analgesia Kerri Wood intrapartum: IVPM, epidural AROM at 2046 08/27/2018  Kerri Wood presented to L&D for planned induction of labor for lupus anticoagulant factor positive and Factor V Leiden carrier status. She was induced with misoprostol, then augmented with pitocin and AROM. IV Fentanyl given for pain relief, later followed by epidural placement for pain relief.   Second Stage: Complete dilation at 0219 Onset of pushing at 0319 FHR second stage: category I Delivery at 0340 on 08/28/2018  She progressed to complete and had a spontaneous vaginal birth of a live female over an intact perineum. The fetal head was delivered in direct OA position with restitution to LOA. No nuchal cord. Anterior then posterior shoulders delivered spontaneously. Baby placed on mom's abdomen and attended to by transition RN. Cord clamped and cut after three minute delay by father of the baby, Kerri Wood. Cord blood obtained for newborn labs. Baby taken to the warmer by transition RN for difficulty breathing, where suction was performed. Baby taken to special care nursery for further attention, as he was still breathing with increased effort after suctioning.   Third Stage: Placenta delivered spontaneously intact with 3VC at 0348 Placenta disposition: routine disposal Uterine tone firm / bleeding initially brisk, then slowed after misoprostol administration. IV pitocin started to be given for hemorrhage prophylaxis, IV access lost, IM pitocin given. misoprostol PR given for bleeding as well.   2nd degree perineal laceration identified  Anesthesia for repair: epidural Repair: 3-0 Vicryl, 4-0 Vicryl Est. Blood Loss (mL): 750  Complications: none  Mom to postpartum.  Baby to NICU.  Newborn: Birth  Weight: 3340g 7lb 5.8oz  Apgar Scores: 8, 9 Feeding planned: breast and formula   Kerri Wood, CNM 08/28/2018 4:38 AM

## 2018-08-21 ENCOUNTER — Other Ambulatory Visit: Payer: Self-pay | Admitting: Obstetrics and Gynecology

## 2018-08-26 ENCOUNTER — Other Ambulatory Visit: Payer: Self-pay | Admitting: Obstetrics and Gynecology

## 2018-08-26 ENCOUNTER — Inpatient Hospital Stay
Admission: EM | Admit: 2018-08-26 | Discharge: 2018-08-31 | DRG: 806 | Disposition: A | Discharge status: Home / Self Care | Payer: Medicaid Other | Attending: Certified Nurse Midwife | Admitting: Certified Nurse Midwife

## 2018-08-26 DIAGNOSIS — O34219 Maternal care for unspecified type scar from previous cesarean delivery: Secondary | ICD-10-CM | POA: Diagnosis present

## 2018-08-26 DIAGNOSIS — O9912 Other diseases of the blood and blood-forming organs and certain disorders involving the immune mechanism complicating childbirth: Principal | ICD-10-CM | POA: Diagnosis present

## 2018-08-26 DIAGNOSIS — O99324 Drug use complicating childbirth: Secondary | ICD-10-CM | POA: Diagnosis present

## 2018-08-26 DIAGNOSIS — Z3A39 39 weeks gestation of pregnancy: Secondary | ICD-10-CM

## 2018-08-26 DIAGNOSIS — D62 Acute posthemorrhagic anemia: Secondary | ICD-10-CM | POA: Diagnosis not present

## 2018-08-26 DIAGNOSIS — D6862 Lupus anticoagulant syndrome: Secondary | ICD-10-CM | POA: Diagnosis present

## 2018-08-26 DIAGNOSIS — F1110 Opioid abuse, uncomplicated: Secondary | ICD-10-CM | POA: Diagnosis present

## 2018-08-26 DIAGNOSIS — Z3490 Encounter for supervision of normal pregnancy, unspecified, unspecified trimester: Secondary | ICD-10-CM

## 2018-08-26 DIAGNOSIS — O9081 Anemia of the puerperium: Secondary | ICD-10-CM | POA: Diagnosis not present

## 2018-08-26 DIAGNOSIS — O8612 Endometritis following delivery: Secondary | ICD-10-CM | POA: Diagnosis not present

## 2018-08-26 DIAGNOSIS — O99334 Smoking (tobacco) complicating childbirth: Secondary | ICD-10-CM | POA: Diagnosis present

## 2018-08-26 DIAGNOSIS — F17200 Nicotine dependence, unspecified, uncomplicated: Secondary | ICD-10-CM | POA: Diagnosis present

## 2018-08-26 DIAGNOSIS — D72829 Elevated white blood cell count, unspecified: Secondary | ICD-10-CM

## 2018-08-26 HISTORY — DX: Major depressive disorder, single episode, unspecified: F32.9

## 2018-08-26 HISTORY — DX: Depression, unspecified: F32.A

## 2018-08-26 NOTE — Progress Notes (Signed)
Additional orders for IOL.

## 2018-08-27 ENCOUNTER — Inpatient Hospital Stay: Payer: Medicaid Other | Admitting: Anesthesiology

## 2018-08-27 ENCOUNTER — Other Ambulatory Visit: Payer: Self-pay

## 2018-08-27 DIAGNOSIS — Z3490 Encounter for supervision of normal pregnancy, unspecified, unspecified trimester: Secondary | ICD-10-CM | POA: Diagnosis present

## 2018-08-27 DIAGNOSIS — F1110 Opioid abuse, uncomplicated: Secondary | ICD-10-CM | POA: Diagnosis present

## 2018-08-27 DIAGNOSIS — O9912 Other diseases of the blood and blood-forming organs and certain disorders involving the immune mechanism complicating childbirth: Secondary | ICD-10-CM | POA: Diagnosis present

## 2018-08-27 DIAGNOSIS — O99334 Smoking (tobacco) complicating childbirth: Secondary | ICD-10-CM | POA: Diagnosis present

## 2018-08-27 DIAGNOSIS — O34219 Maternal care for unspecified type scar from previous cesarean delivery: Secondary | ICD-10-CM | POA: Diagnosis present

## 2018-08-27 DIAGNOSIS — O8612 Endometritis following delivery: Secondary | ICD-10-CM | POA: Diagnosis not present

## 2018-08-27 DIAGNOSIS — D6862 Lupus anticoagulant syndrome: Secondary | ICD-10-CM | POA: Diagnosis present

## 2018-08-27 DIAGNOSIS — O99324 Drug use complicating childbirth: Secondary | ICD-10-CM | POA: Diagnosis present

## 2018-08-27 DIAGNOSIS — O9081 Anemia of the puerperium: Secondary | ICD-10-CM | POA: Diagnosis not present

## 2018-08-27 DIAGNOSIS — D62 Acute posthemorrhagic anemia: Secondary | ICD-10-CM | POA: Diagnosis not present

## 2018-08-27 DIAGNOSIS — F17200 Nicotine dependence, unspecified, uncomplicated: Secondary | ICD-10-CM | POA: Diagnosis present

## 2018-08-27 DIAGNOSIS — Z3A39 39 weeks gestation of pregnancy: Secondary | ICD-10-CM | POA: Diagnosis not present

## 2018-08-27 LAB — CBC
HCT: 36 % (ref 36.0–46.0)
Hemoglobin: 11.9 g/dL — ABNORMAL LOW (ref 12.0–15.0)
MCH: 27.4 pg (ref 26.0–34.0)
MCHC: 33.1 g/dL (ref 30.0–36.0)
MCV: 82.9 fL (ref 80.0–100.0)
Platelets: 287 10*3/uL (ref 150–400)
RBC: 4.34 MIL/uL (ref 3.87–5.11)
RDW: 12.8 % (ref 11.5–15.5)
WBC: 9.1 10*3/uL (ref 4.0–10.5)
nRBC: 0 % (ref 0.0–0.2)

## 2018-08-27 LAB — URINE DRUG SCREEN, QUALITATIVE (ARMC ONLY)
AMPHETAMINES, UR SCREEN: NOT DETECTED
Barbiturates, Ur Screen: NOT DETECTED
Benzodiazepine, Ur Scrn: NOT DETECTED
Cannabinoid 50 Ng, Ur ~~LOC~~: NOT DETECTED
Cocaine Metabolite,Ur ~~LOC~~: NOT DETECTED
MDMA (ECSTASY) UR SCREEN: NOT DETECTED
Methadone Scn, Ur: NOT DETECTED
Opiate, Ur Screen: NOT DETECTED
Phencyclidine (PCP) Ur S: NOT DETECTED
Tricyclic, Ur Screen: NOT DETECTED

## 2018-08-27 LAB — OB RESULTS CONSOLE GC/CHLAMYDIA
Chlamydia: NEGATIVE
Gonorrhea: NEGATIVE

## 2018-08-27 LAB — OB RESULTS CONSOLE HIV ANTIBODY (ROUTINE TESTING): HIV: NONREACTIVE

## 2018-08-27 LAB — TYPE AND SCREEN
ABO/RH(D): O POS
Antibody Screen: NEGATIVE

## 2018-08-27 LAB — OB RESULTS CONSOLE GBS: GBS: NEGATIVE

## 2018-08-27 LAB — OB RESULTS CONSOLE HEPATITIS B SURFACE ANTIGEN: Hepatitis B Surface Ag: NEGATIVE

## 2018-08-27 LAB — OB RESULTS CONSOLE VARICELLA ZOSTER ANTIBODY, IGG: Varicella: NON-IMMUNE/NOT IMMUNE

## 2018-08-27 LAB — OB RESULTS CONSOLE RPR: RPR: NONREACTIVE

## 2018-08-27 LAB — OB RESULTS CONSOLE RUBELLA ANTIBODY, IGM: Rubella: IMMUNE

## 2018-08-27 MED ORDER — ZOLPIDEM TARTRATE 5 MG PO TABS
ORAL_TABLET | ORAL | Status: AC
  Filled 2018-08-27: qty 1

## 2018-08-27 MED ORDER — LIDOCAINE HCL (PF) 1 % IJ SOLN: 30.0000 mL | INTRAMUSCULAR | Status: DC | PRN

## 2018-08-27 MED ORDER — ONDANSETRON HCL 4 MG/2ML IJ SOLN: 4.0000 mg | Freq: Four times a day (QID) | INTRAMUSCULAR | Status: DC | PRN

## 2018-08-27 MED ORDER — EPHEDRINE 5 MG/ML INJ
10.0000 mg | INTRAVENOUS | Status: DC | PRN
  Filled 2018-08-27: qty 2

## 2018-08-27 MED ORDER — PHENYLEPHRINE 40 MCG/ML (10ML) SYRINGE FOR IV PUSH (FOR BLOOD PRESSURE SUPPORT)
80.0000 ug | PREFILLED_SYRINGE | INTRAVENOUS | Status: DC | PRN
  Filled 2018-08-27: qty 10

## 2018-08-27 MED ORDER — ACETAMINOPHEN 325 MG PO TABS: 650.0000 mg | ORAL_TABLET | ORAL | Status: DC | PRN

## 2018-08-27 MED ORDER — MISOPROSTOL 25 MCG QUARTER TABLET
25.0000 ug | ORAL_TABLET | Freq: Once | ORAL | Status: DC
  Filled 2018-08-27: qty 1

## 2018-08-27 MED ORDER — SOD CITRATE-CITRIC ACID 500-334 MG/5ML PO SOLN: 30.0000 mL | ORAL | Status: DC | PRN

## 2018-08-27 MED ORDER — FENTANYL 2.5 MCG/ML W/ROPIVACAINE 0.15% IN NS 100 ML EPIDURAL (ARMC)
EPIDURAL | Status: AC
  Filled 2018-08-27: qty 100

## 2018-08-27 MED ORDER — ZOLPIDEM TARTRATE 5 MG PO TABS
5.0000 mg | ORAL_TABLET | Freq: Every evening | ORAL | Status: DC | PRN
  Administered 2018-08-27: 5 mg via ORAL

## 2018-08-27 MED ORDER — OXYTOCIN 40 UNITS IN NORMAL SALINE INFUSION - SIMPLE MED: 2.5000 [IU]/h | INTRAVENOUS | Status: DC

## 2018-08-27 MED ORDER — LIDOCAINE HCL (PF) 1 % IJ SOLN
INTRAMUSCULAR | Status: DC | PRN
  Administered 2018-08-27: 2 mL via SUBCUTANEOUS

## 2018-08-27 MED ORDER — LACTATED RINGERS IV SOLN
INTRAVENOUS | Status: DC
  Administered 2018-08-27 (×3): via INTRAVENOUS

## 2018-08-27 MED ORDER — MISOPROSTOL 25 MCG QUARTER TABLET
25.0000 ug | ORAL_TABLET | ORAL | Status: DC
  Administered 2018-08-27: 25 ug via VAGINAL

## 2018-08-27 MED ORDER — MISOPROSTOL 25 MCG QUARTER TABLET: 25.0000 ug | ORAL_TABLET | ORAL | Status: DC | PRN

## 2018-08-27 MED ORDER — TERBUTALINE SULFATE 1 MG/ML IJ SOLN: 0.2500 mg | Freq: Once | INTRAMUSCULAR | Status: DC | PRN

## 2018-08-27 MED ORDER — SODIUM CHLORIDE 0.9 % IV SOLN
INTRAVENOUS | Status: DC | PRN
  Administered 2018-08-27 (×2): 5 mL via EPIDURAL

## 2018-08-27 MED ORDER — LACTATED RINGERS IV SOLN: 500.0000 mL | Freq: Once | INTRAVENOUS | Status: DC

## 2018-08-27 MED ORDER — MISOPROSTOL 200 MCG PO TABS
ORAL_TABLET | ORAL | Status: AC
  Administered 2018-08-27: 25 ug via BUCCAL
  Filled 2018-08-27: qty 4

## 2018-08-27 MED ORDER — OXYTOCIN BOLUS FROM INFUSION
500.0000 mL | Freq: Once | INTRAVENOUS | Status: AC
  Administered 2018-08-28: 500 mL via INTRAVENOUS

## 2018-08-27 MED ORDER — OXYTOCIN 10 UNIT/ML IJ SOLN
INTRAMUSCULAR | Status: AC
  Administered 2018-08-28: 10 [IU] via INTRAMUSCULAR
  Filled 2018-08-27: qty 2

## 2018-08-27 MED ORDER — LIDOCAINE-EPINEPHRINE (PF) 1.5 %-1:200000 IJ SOLN
INTRAMUSCULAR | Status: DC | PRN
  Administered 2018-08-27: 3 mL via EPIDURAL

## 2018-08-27 MED ORDER — FENTANYL CITRATE (PF) 100 MCG/2ML IJ SOLN
50.0000 ug | INTRAMUSCULAR | Status: DC | PRN
  Administered 2018-08-27 (×3): 50 ug via INTRAVENOUS
  Filled 2018-08-27 (×3): qty 2

## 2018-08-27 MED ORDER — OXYTOCIN 40 UNITS IN NORMAL SALINE INFUSION - SIMPLE MED
1.0000 m[IU]/min | INTRAVENOUS | Status: DC
  Administered 2018-08-27: 2 m[IU]/min via INTRAVENOUS
  Filled 2018-08-27: qty 1000

## 2018-08-27 MED ORDER — MISOPROSTOL 25 MCG QUARTER TABLET
25.0000 ug | ORAL_TABLET | ORAL | Status: DC | PRN
  Administered 2018-08-27: 25 ug via BUCCAL
  Filled 2018-08-27: qty 1

## 2018-08-27 MED ORDER — LIDOCAINE HCL (PF) 1 % IJ SOLN
INTRAMUSCULAR | Status: AC
  Filled 2018-08-27: qty 30

## 2018-08-27 MED ORDER — LACTATED RINGERS IV SOLN: 500.0000 mL | INTRAVENOUS | Status: DC | PRN

## 2018-08-27 MED ORDER — AMMONIA AROMATIC IN INHA
RESPIRATORY_TRACT | Status: AC
  Filled 2018-08-27: qty 10

## 2018-08-27 MED ORDER — DIPHENHYDRAMINE HCL 50 MG/ML IJ SOLN: 12.5000 mg | INTRAMUSCULAR | Status: DC | PRN

## 2018-08-27 MED ORDER — FENTANYL 2.5 MCG/ML W/ROPIVACAINE 0.15% IN NS 100 ML EPIDURAL (ARMC)
12.0000 mL/h | EPIDURAL | Status: DC
  Administered 2018-08-27: 12 mL/h via EPIDURAL
  Filled 2018-08-27: qty 100

## 2018-08-27 NOTE — Progress Notes (Signed)
Labor Progress Note  Kerri Wood is a 33 y.o. G2P1001 at [redacted]w[redacted]d by LMP c/w [redacted]w[redacted]d ultrasound admitted for induction of labor due to positive lupus anticoagulant factor and Factor V Leiden carrier status.  Subjective:  Patient reports that contractions are more intense. IVPM helping to take the edge off.   Objective: BP 103/66   Pulse (!) 59   Temp 97.8 F (36.6 C) (Oral)   Resp 15   Ht 5\' 5"  (1.651 m)   Wt 68 kg   LMP 11/25/2017 (Exact Date)   BMI 24.96 kg/m   Fetal Assessment: FHT:  FHR: 120 bpm, variability: moderate,  accelerations:  Present,  decelerations:  Absent Category/reactivity:  Category I UC:   regular, every 1.5-3 minutes SVE:   Per RN Mindy Edge at 1518 Dilation: 3.5cm  Effacement: 70-80%  Station:  -3  Consistency: soft  Position: posterior  Membrane status: intact Amniotic color: n/a  Labs: Lab Results  Component Value Date   WBC 9.1 08/27/2018   HGB 11.9 (L) 08/27/2018   HCT 36.0 08/27/2018   MCV 82.9 08/27/2018   PLT 287 08/27/2018    Assessment / Plan: Induction of labor, latent labor  Labor: S/p misoprostol x1 dose, pitocin infusing at 56mu/min. Plan to titrate pitocin PRN per protocol.  Fetal Wellbeing:  Category I Pain Control:  Maternal pain control as desired: IVPM, nitrous, regional anesthesia Anticipated MOD:  NSVD  Genia Del, CNM 08/27/2018, 4:52 PM

## 2018-08-27 NOTE — Anesthesia Preprocedure Evaluation (Signed)
Anesthesia Evaluation  Patient identified by MRN, date of birth, ID band Patient awake    Reviewed: Allergy & Precautions, H&P , NPO status , Patient's Chart, lab work & pertinent test results, reviewed documented beta blocker date and time   History of Anesthesia Complications Negative for: history of anesthetic complications  Airway Mallampati: II  TM Distance: >3 FB Neck ROM: full    Dental no notable dental hx.    Pulmonary neg pulmonary ROS, former smoker,           Cardiovascular Exercise Tolerance: Good (-) hypertension+ DVT  (-) dysrhythmias      Neuro/Psych PSYCHIATRIC DISORDERS Depression negative neurological ROS     GI/Hepatic negative GI ROS, (+)     substance abuse (history of, currently on suboxone)  IV drug use,   Endo/Other  negative endocrine ROS  Renal/GU negative Renal ROS  negative genitourinary   Musculoskeletal   Abdominal   Peds  Hematology negative hematology ROS (+)   Anesthesia Other Findings Past Medical History: No date: Depression No date: DVT (deep venous thrombosis) (HCC)  Factor V Leiden and Lupus Anticoagulant. Last dose of lovenox was Monday (1/13) morning. Plan for epidural and notified cannot restart Lovenox until > or = 12 hours post catheter removal.  Reproductive/Obstetrics (+) Pregnancy                             Anesthesia Physical Anesthesia Plan  ASA: II  Anesthesia Plan: General   Post-op Pain Management:    Induction:   PONV Risk Score and Plan:   Airway Management Planned:   Additional Equipment:   Intra-op Plan:   Post-operative Plan:   Informed Consent: I have reviewed the patients History and Physical, chart, labs and discussed the procedure including the risks, benefits and alternatives for the proposed anesthesia with the patient or authorized representative who has indicated his/her understanding and acceptance.      Dental Advisory Given  Plan Discussed with: Anesthesiologist, CRNA and Surgeon  Anesthesia Plan Comments:         Anesthesia Quick Evaluation

## 2018-08-27 NOTE — H&P (Signed)
OB History & Physical   History of Present Illness:  Chief Complaint: presents for planned IOL  HPI:  Kerri Wood is a 33 y.o. G2P0 female at [redacted]w[redacted]d dated by LMP c/w [redacted]w[redacted]d ultrasound. She presented to L&D for planned induction of labor for positive lupus anticoagulant factor and Factor V Leiden carrier status.   She reports:  -active fetal movement -no leakage of fluid -no vaginal bleeding  Pregnancy Issues: 1. History of superficial thrombophlebitis, Factor V Leiden heterozygote, lupus anticoagulant factor positive; on Lovenox throughout pregnancy 2. LowPAPP-A with increased NT, normal cfDNA 3. History of opioid use disorder, currently in treatment at [facility], on Suboxone 4. Tobacco use 5. Poor dentition, antibiotics for abscess during pregnancy   Maternal Medical History:   Past Medical History:  Diagnosis Date  . Depression   . DVT (deep venous thrombosis) (HCC)     Past Surgical History:  Procedure Laterality Date  . OTHER SURGICAL HISTORY     Right arm surgery for piece of metal removal    Allergies  Allergen Reactions  . Morphine And Related Hives    Prior to Admission medications   Medication Sig Start Date End Date Taking? Authorizing Provider  aspirin EC 81 MG tablet Take 81 mg by mouth.   Yes [provider]  buprenorphine-naloxone (SUBOXONE) 8-2 mg SUBL SL tablet Place 1 tablet under the tongue daily.   Yes [provider]  enoxaparin (LOVENOX) 40 MG/0.4ML injection Inject 0.6 mLs (60 mg total) into the skin every 12 (twelve) hours. 04/03/18 04/03/19 Yes Camelia Phenes, MD  Prenatal Vit-Fe Fumarate-FA (PRENATAL MULTIVITAMIN) TABS tablet Take 1 tablet by mouth daily at 12 noon.   Yes [provider]     Prenatal care site: [facility]  Social History: She  reports that she quit smoking today. She smoked 0.00 packs per day. She has quit using smokeless tobacco. She reports previous alcohol use. She  reports previous drug use.  Family History: family history includes Cancer in her maternal grandmother and paternal grandmother; Depression in her brother and mother.   Review of Systems: A full review of systems was performed and negative except as noted in the HPI.    Physical Exam:  Vital Signs: BP 117/68 (BP Location: Left Arm)   Pulse 68   Temp 97.8 F (36.6 C) (Oral)   Resp 15   Ht 5\' 5"  (1.651 m)   Wt 68 kg   LMP 11/25/2017 (Exact Date)   BMI 24.96 kg/m   General:   alert, cooperative, appears stated age and no distress  Skin:  normal and no rash or abnormalities  Neurologic:    Alert & oriented x 3  Lungs:   clear to auscultation bilaterally  Heart:   regular rate and rhythm, S1, S2 normal, no murmur, click, rub or gallop  Abdomen:  soft, non-tender; bowel sounds normal; no masses,  no organomegaly  Pelvis:  Exam deferred.  FHT:  130 BPM  Presentations: cephalic  Cervix:  Per RN Kemper Durie at [phone]   Dilation: 2cm   Effacement: 50%   Station:  -3   Consistency: soft   Position: posterior  Extremities: : non-tender, symmetric, no edema bilaterally.     EFW: 08/11/18: EFW 2920g 6lb7oz 41% by ultrasound  Results for orders placed or performed during the hospital encounter of 08/26/18 (from the past 24 hour(s))  Urine Drug Screen, Qualitative ([facility] only)     Status: None   Collection Time: 08/27/18  1:25 AM  Result Value Ref Range   Tricyclic, Ur Screen NONE DETECTED NONE DETECTED   Amphetamines, Ur Screen NONE DETECTED NONE DETECTED   MDMA (Ecstasy)Ur Screen NONE DETECTED NONE DETECTED   Cocaine Metabolite,Ur [place] NONE DETECTED NONE DETECTED   Opiate, Ur Screen NONE DETECTED NONE DETECTED   Phencyclidine (PCP) Ur S NONE DETECTED NONE DETECTED   Cannabinoid 50 Ng, Ur  NONE DETECTED NONE DETECTED   Barbiturates, Ur Screen NONE DETECTED NONE DETECTED   Benzodiazepine, Ur Scrn NONE DETECTED NONE DETECTED   Methadone Scn, Ur NONE DETECTED NONE DETECTED  CBC      Status: Abnormal   Collection Time: 08/27/18  1:43 AM  Result Value Ref Range   WBC 9.1 4.0 - 10.5 K/uL   RBC 4.34 3.87 - 5.11 MIL/uL   Hemoglobin 11.9 (L) 12.0 - 15.0 g/dL   HCT 86.536.0 78.436.0 - 69.646.0 %   MCV 82.9 80.0 - 100.0 fL   MCH 27.4 26.0 - 34.0 pg   MCHC 33.1 30.0 - 36.0 g/dL   RDW 29.512.8 28.411.5 - 13.215.5 %   Platelets 287 150 - 400 K/uL   nRBC 0.0 0.0 - 0.2 %  Type and screen     Status: None   Collection Time: 08/27/18  1:43 AM  Result Value Ref Range   ABO/RH(D) O POS    Antibody Screen NEG    Sample Expiration      08/30/2018 Performed at [facility], [address] [place], [place] [id number]   OB RESULT CONSOLE Group B Strep     Status: None   Collection Time: 08/27/18  6:36 AM  Result Value Ref Range   GBS Negative   OB RESULTS CONSOLE GC/Chlamydia     Status: None   Collection Time: 08/27/18  6:36 AM  Result Value Ref Range   Gonorrhea Negative    Chlamydia Negative   OB RESULTS CONSOLE RPR     Status: None   Collection Time: 08/27/18  6:36 AM  Result Value Ref Range   RPR Nonreactive   OB RESULTS CONSOLE HIV antibody     Status: None   Collection Time: 08/27/18  6:36 AM  Result Value Ref Range   HIV Non-reactive   OB RESULTS CONSOLE Rubella Antibody     Status: None   Collection Time: 08/27/18  6:36 AM  Result Value Ref Range   Rubella Immune   OB RESULTS CONSOLE Varicella zoster antibody, IgG     Status: None   Collection Time: 08/27/18  6:36 AM  Result Value Ref Range   Varicella Nonimmune   OB RESULTS CONSOLE Hepatitis B surface antigen     Status: None   Collection Time: 08/27/18  6:36 AM  Result Value Ref Range   Hepatitis B Surface Ag Negative     Pertinent Results:  Prenatal Labs: Blood type/Rh O+  Antibody screen neg  Rubella Immune  Varicella Immune  RPR NR  HBsAg Neg  HIV NR  GC neg  Chlamydia neg  Genetic screening Low PAPP-A, NT increased, cfDNA negative  1 hour GTT 147  3 hour GTT 75, 145, 142, 123  GBS negative   FHT:  FHR: 125 bpm, variability: moderate,  accelerations:  Present,  decelerations:  Absent Category/reactivity:  Category I TOCO: irregular, every 2-4 minutes  Assessment:  Alesia MorinCrystal Bouknight is a 33 y.o. 492P1001 female at 2946w2d with IOL for LAC and FVL carrier.   Plan:  1. Admit to Labor & Delivery; consents  reviewed and obtained  2. Fetal Well being  - Fetal Tracing: category I - GBS negative - Presentation: cephalic confirmed by Leopold's   3. Routine OB: - Prenatal labs reviewed, as above - Rh positive - CBC & T&S on admit - Clear fluids, IVF  4. Induction of Labor -  Contractions monitored with external toco in place -  Pelvis proven to 7lb 14oz -  Plan for induction with misoprostol -  Plan for continuous fetal monitoring  -  Maternal pain control as desired: IVPM, nitrous, regional anesthesia -  Anticipate vaginal delivery -  SCDs in place during labor  5. Post Partum Planning: - Infant feeding: breast - Contraception: TBD - Thromboprophylaxis with Lovenox 40mg  daily SQ, restart PP 12hrs s/p SVD or 24hrs s/p C/S  Genia DelMargaret Markeesha Char, CNM 08/27/2018 8:27 AM ----- Genia DelMargaret Clorinda Wyble Certified Nurse Midwife [facility], Department of OB/GYN [facility]

## 2018-08-27 NOTE — Anesthesia Procedure Notes (Signed)
Epidural Patient location during procedure: OB Start time: 08/27/2018 8:01 PM End time: 08/27/2018 8:08 PM  Staffing Anesthesiologist: Lenard Simmer, MD Performed: anesthesiologist   Preanesthetic Checklist Completed: patient identified, site marked, surgical consent, pre-op evaluation, timeout performed, IV checked, risks and benefits discussed and monitors and equipment checked  Epidural Patient position: sitting Prep: ChloraPrep Patient monitoring: heart rate, continuous pulse ox and blood pressure Approach: midline Location: L3-L4 Injection technique: LOR saline  Needle:  Needle type: Tuohy  Needle gauge: 17 G Needle length: 9 cm and 9 Needle insertion depth: 3.5 cm Catheter type: closed end flexible Catheter size: 19 Gauge Catheter at skin depth: 8 cm Test dose: negative and 1.5% lidocaine with Epi 1:200 K  Assessment Sensory level: T10 Events: blood not aspirated, injection not painful, no injection resistance, negative IV test and no paresthesia  Additional Notes 1st attempt Pt. Evaluated and documentation done after procedure finished. Patient identified. Risks/Benefits/Options discussed with patient including but not limited to bleeding, infection, nerve damage, paralysis, failed block, incomplete pain control, headache, blood pressure changes, nausea, vomiting, reactions to medication both or allergic, itching and postpartum back pain. Confirmed with bedside nurse the patient's most recent platelet count. Confirmed with patient that they are not currently taking any anticoagulation, have any bleeding history or any family history of bleeding disorders. Patient expressed understanding and wished to proceed. All questions were answered. Sterile technique was used throughout the entire procedure. Please see nursing notes for vital signs. Test dose was given through epidural catheter and negative prior to continuing to dose epidural or start infusion. Warning signs of high  block given to the patient including shortness of breath, tingling/numbness in hands, complete motor block, or any concerning symptoms with instructions to call for help. Patient was given instructions on fall risk and not to get out of bed. All questions and concerns addressed with instructions to call with any issues or inadequate analgesia.   Patient tolerated the insertion well without immediate complications.Reason for block:procedure for pain

## 2018-08-27 NOTE — Progress Notes (Addendum)
Labor Progress Note  Kerri Wood is a 33 y.o. G2P1001 at [redacted]w[redacted]d by LMP c/w [redacted]w[redacted]d ultrasound admitted for induction of labor due to positive lupus anticoagulant factor and Factor V Leiden carrier status.  Subjective:  Patient reports that contractions are very intense. She is requesting epidural now.   Objective: BP 101/71   Pulse 64   Temp 97.8 F (36.6 C) (Oral)   Resp 15   Ht 5\' 5"  (1.651 m)   Wt 68 kg   LMP 11/26/2017   BMI 24.96 kg/m   Fetal Assessment: FHT:  FHR: 130 bpm, variability: moderate,  accelerations:  Present,  decelerations:  Absent Category/reactivity:  Category I UC:   regular, every 1.5-2 minutes SVE: Dilation: 5cm  Effacement: 80%  Station:  -1  Consistency: soft  Position: posterior  Membrane status: intact Amniotic color: n/a  Labs: Lab Results  Component Value Date   WBC 9.1 08/27/2018   HGB 11.9 (L) 08/27/2018   HCT 36.0 08/27/2018   MCV 82.9 08/27/2018   PLT 287 08/27/2018    Assessment / Plan: Induction of labor, latent labor  Labor: S/p misoprostol x1 dose, pitocin infusing at 38mu/min. Adequate contraction pattern currently. Plan for AROM after epidural placement.  Fetal Wellbeing:  Category I Pain Control:  Patient requesting epidural now. Last dose of Lovenox 08/25/2018 AM. Per MFM, regional anesthesia may generally be used 12-24 hours after last dose of Lovenox, so no anticipated issues with epidural administration at this time.  Anticipated MOD:  NSVD   Genia Del, CNM 08/27/2018, 7:36 PM

## 2018-08-27 NOTE — Progress Notes (Signed)
Labor Progress Note  Kerri Wood is a 33 y.o. G2P1001 at [redacted]w[redacted]d by LMP c/w [redacted]w[redacted]d ultrasound admitted for induction of labor due to positive lupus anticoagulant factor and Factor V Leiden carrier status.  Subjective:  Patient reports that contractions are less intense than they were. Rates them 3-4/10 discomfort, as compared to 7/10 discomfort earlier this morning. Reports that nitrous oxide did not help with contraction pain.   Objective: BP 121/71   Pulse 64   Temp 98 F (36.7 C) (Oral)   Resp 15   Ht 5\' 5"  (1.651 m)   Wt 68 kg   LMP 11/25/2017 (Exact Date)   BMI 24.96 kg/m   Fetal Assessment: FHT:  FHR: 120 bpm, variability: moderate,  accelerations:  Present,  decelerations:  Absent Category/reactivity:  Category I UC:   regular, every 2-3 minutes SVE:   Per RN Mindy Edge at 1030 Dilation: 3cm  Effacement: 50%  Station:  -3  Consistency: soft  Position: posterior  Membrane status: intact Amniotic color: n/a  Labs: Lab Results  Component Value Date   WBC 9.1 08/27/2018   HGB 11.9 (L) 08/27/2018   HCT 36.0 08/27/2018   MCV 82.9 08/27/2018   PLT 287 08/27/2018    Assessment / Plan: Induction of labor, latent labor  Labor: S/p misoprostol x1 dose, contracting regularly. Plan to start pitocin if contractions space or continue to weaken in intesnity. Fetal Wellbeing:  Category I Pain Control:  Maternal pain control as desired: IVPM, nitrous, regional anesthesia Anticipated MOD:  NSVD  Genia Del, CNM 08/27/2018, 11:54 AM

## 2018-08-27 NOTE — Progress Notes (Signed)
Labor Progress Note  Kerri Wood is a 33 y.o. G2P1001 at [redacted]w[redacted]d by LMP c/w [redacted]w[redacted]d ultrasound admitted for induction of labor due to positive lupus anticoagulant factor and Factor V Leiden carrier status.  Subjective:  Patient much more comfortable after epidural placement.    Objective: BP (!) 112/59   Pulse 66   Temp 98.1 F (36.7 C) (Oral)   Resp 15   Ht 5\' 5"  (1.651 m)   Wt 68 kg   LMP 11/26/2017   SpO2 96%   BMI 24.96 kg/m   Fetal Assessment: FHT:  FHR: 120 bpm, variability: moderate,  accelerations:  Present,  decelerations:  Absent Category/reactivity:  Category I UC:   regular, every 1.5-3 minutes SVE:    Dilation: 5cm  Effacement: 80%  Station:  -1  Consistency: soft  Position: middle  Membrane status: AROM 2046 Amniotic color: clear, blood-tinged  Labs: Lab Results  Component Value Date   WBC 9.1 08/27/2018   HGB 11.9 (L) 08/27/2018   HCT 36.0 08/27/2018   MCV 82.9 08/27/2018   PLT 287 08/27/2018    Assessment / Plan: Induction of labor, latent labor  Labor: S/p misoprostol x1 dose, AROM now. Pitocin started at 1322, currently infusing at 77mu/min. Plan to titrate per protocol PRN.  Fetal Wellbeing:  Category I Pain Control:  S/p epidural placement, patient comfortable.  Anticipated MOD:  NSVD   Genia Del, CNM 08/27/2018, 8:52 PM

## 2018-08-28 LAB — RPR: RPR Ser Ql: NONREACTIVE

## 2018-08-28 MED ORDER — ENOXAPARIN SODIUM 60 MG/0.6ML ~~LOC~~ SOLN
1.0000 mg/kg | Freq: Two times a day (BID) | SUBCUTANEOUS | Status: DC
  Administered 2018-08-28 – 2018-08-30 (×4): 60 mg via SUBCUTANEOUS
  Filled 2018-08-28 (×4): qty 0.6

## 2018-08-28 MED ORDER — PRENATAL MULTIVITAMIN CH
1.0000 | ORAL_TABLET | Freq: Every day | ORAL | Status: DC
  Administered 2018-08-28 – 2018-08-31 (×4): 1 via ORAL
  Filled 2018-08-28 (×4): qty 1

## 2018-08-28 MED ORDER — IBUPROFEN 600 MG PO TABS
600.0000 mg | ORAL_TABLET | Freq: Four times a day (QID) | ORAL | Status: DC
  Administered 2018-08-28 – 2018-08-31 (×15): 600 mg via ORAL
  Filled 2018-08-28 (×14): qty 1

## 2018-08-28 MED ORDER — SIMETHICONE 80 MG PO CHEW: 160.0000 mg | CHEWABLE_TABLET | ORAL | Status: DC | PRN

## 2018-08-28 MED ORDER — DIBUCAINE 1 % RE OINT: 1.0000 "application " | TOPICAL_OINTMENT | RECTAL | Status: DC | PRN

## 2018-08-28 MED ORDER — BENZOCAINE-MENTHOL 20-0.5 % EX AERO: 1.0000 "application " | INHALATION_SPRAY | CUTANEOUS | Status: DC | PRN

## 2018-08-28 MED ORDER — WITCH HAZEL-GLYCERIN EX PADS: 1.0000 "application " | MEDICATED_PAD | CUTANEOUS | Status: DC | PRN

## 2018-08-28 MED ORDER — ONDANSETRON HCL 4 MG/2ML IJ SOLN: 4.0000 mg | INTRAMUSCULAR | Status: DC | PRN

## 2018-08-28 MED ORDER — BUPRENORPHINE HCL-NALOXONE HCL 8-2 MG SL SUBL
1.0000 | SUBLINGUAL_TABLET | Freq: Every day | SUBLINGUAL | Status: DC
  Administered 2018-08-28: 1 via SUBLINGUAL
  Filled 2018-08-28 (×2): qty 1

## 2018-08-28 MED ORDER — SENNOSIDES-DOCUSATE SODIUM 8.6-50 MG PO TABS
2.0000 | ORAL_TABLET | ORAL | Status: DC
  Administered 2018-08-29 – 2018-08-31 (×3): 2 via ORAL
  Filled 2018-08-28 (×3): qty 2

## 2018-08-28 MED ORDER — IBUPROFEN 600 MG PO TABS
ORAL_TABLET | ORAL | Status: AC
  Administered 2018-08-28: 600 mg via ORAL
  Filled 2018-08-28: qty 1

## 2018-08-28 MED ORDER — COCONUT OIL OIL
1.0000 "application " | TOPICAL_OIL | Status: DC | PRN
  Administered 2018-08-29: 1 via TOPICAL
  Filled 2018-08-28: qty 120

## 2018-08-28 MED ORDER — DIPHENHYDRAMINE HCL 25 MG PO CAPS: 25.0000 mg | ORAL_CAPSULE | Freq: Four times a day (QID) | ORAL | Status: DC | PRN

## 2018-08-28 MED ORDER — MISOPROSTOL 200 MCG PO TABS
800.0000 ug | ORAL_TABLET | Freq: Once | ORAL | Status: AC
  Administered 2018-08-28: 800 ug via RECTAL

## 2018-08-28 MED ORDER — ONDANSETRON HCL 4 MG PO TABS: 4.0000 mg | ORAL_TABLET | ORAL | Status: DC | PRN

## 2018-08-28 MED ORDER — ACETAMINOPHEN 325 MG PO TABS
650.0000 mg | ORAL_TABLET | ORAL | Status: DC | PRN
  Administered 2018-08-28 – 2018-08-29 (×4): 650 mg via ORAL
  Filled 2018-08-28 (×4): qty 2

## 2018-08-28 MED ORDER — OXYTOCIN 10 UNIT/ML IJ SOLN
10.0000 [IU] | Freq: Once | INTRAMUSCULAR | Status: AC
  Administered 2018-08-28: 10 [IU] via INTRAMUSCULAR

## 2018-08-28 MED ORDER — BUPRENORPHINE HCL-NALOXONE HCL 8-2 MG SL SUBL
1.0000 | SUBLINGUAL_TABLET | Freq: Three times a day (TID) | SUBLINGUAL | Status: DC
  Administered 2018-08-28 – 2018-08-31 (×9): 1 via SUBLINGUAL
  Filled 2018-08-28 (×9): qty 1

## 2018-08-28 NOTE — Discharge Instructions (Signed)

## 2018-08-28 NOTE — Discharge Summary (Signed)
Obstetric Discharge Summary   Patient ID: Patient Name: Kerri MorinCrystal Wood DOB: 12/19/1985 MRN: [id number]  Date of Admission: 08/26/2018 Date of Delivery: 08/28/2018 Delivered by: Kerri Wood, CNM Date of Discharge: 08/31/2018  [facility]  WUJ:[id number]:Patient's last menstrual period was 11/26/2017. EDC Estimated Date of Delivery: 09/02/18 Gestational Age at Delivery: 1242w2d   Antepartum complications:  1. History of superficial thrombophlebitis, Factor V Leiden heterozygote, lupus anticoagulant factor positive; on Lovenox throughout pregnancy 2. LowPAPP-A with increased NT, normal cfDNA 3. History of opioid use disorder, currently in treatment at [facility], on Suboxone 4. Tobacco use 5. Poor dentition, antibiotics for abscess during pregnancy  Admitting Diagnosis: planned induction of labor  Secondary Diagnoses: Leukocytosis, PP endometritis SVD, 2nd deg laceration  Patient Active Problem List   Diagnosis Date Noted  . Encounter for planned induction of labor 08/27/2018  . Superficial thrombophlebitis 03/24/2018  . 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 Abnormal first trimester screen   . Family history of intellectual disabilities   . First trimester screening    Induction: misoprostol Augmentation: AROM and Pitocin Complications: None Intrapartum complications/course: Kerri Wood presented to L&D for planned induction of labor for lupus anticoagulant factor positive and Factor V Leiden carrier status. She was induced with misoprostol, then augmented with pitocin and AROM. IV Fentanyl given for pain relief, later followed by epidural placement for pain relief. She progressed to complete and had a spontaneous vaginal birth of a live female over an intact perineum. The fetal head was delivered in direct OA position with restitution to LOA. No nuchal  cord. Anterior then posterior shoulders delivered spontaneously. Baby placed on mom's abdomen and attended to by transition RN. Cord clamped and cut after three minute delay by father of the baby, Kerri Wood. Cord blood obtained for newborn labs. Baby taken to the warmer by transition RN for difficulty breathing, where suction was performed. Baby taken to special care nursery for further attention, as he was still breathing with increased effort after suctioning. Placenta delivered spontaneously intact with 3VC at 0348. Uterine tone firm, but bleeding initially brisk. 2nd degree perineal laceration identified. After repair was started, patient was noted to continue to have uterine bleeding. Bleeding slowed with fundal massage, but recurred. IV pitocin had been started for hemorrhage prophylaxis. 800mcg misoprostol PR given. As laceration repair continued, it became apparent that PIV had infiltrated, so IVF and IV pitocin were stopped. 10 IM pitocin given. Laceration repaired in the usual fashion.   Delivery Type: spontaneous vaginal delivery Anesthesia: epidural Placenta: spontaneous Laceration: 2nd degree perineal, repaired Episiotomy: none  Newborn Data: Live born female "Kerri Wood" Birth Weight:  3340g 7lb 5.8oz APGAR: 8, 9  Newborn Delivery   Birth date/time:  08/28/2018 03:40:00 Delivery type:  Vaginal, Spontaneous     Postpartum Course  Patient had a complicated postpartum course with development of fever on PP day 1 and elevated WBCs to 21k, was started on Augmentin PO for leukocytosis and PP endometritis.  By time of discharge on PPD#3, her pain was controlled on oral pain medications; she had appropriate lochia and was ambulating, voiding without difficulty and tolerating regular diet. Her WBCs were demonstrated to be falling today. She was deemed stable for discharge to home.       Labs: CBC Latest Ref Rng & Units 08/31/2018 08/30/2018 08/29/2018  WBC 4.0 - 10.5 K/uL 25.3(H) 34.9(H) 21.5(H)   Hemoglobin 12.0 - 15.0 g/dL 2.9(F8.8(L)  9.3(L) 9.5(L)  Hematocrit 36.0 - 46.0 % 26.1(L) 28.6(L) 28.5(L)  Platelets 150 - 400 K/uL 346 313 275   O POS  Physical exam:  BP 113/75 (BP Location: Right Arm)   Pulse 92   Temp 98.5 F (36.9 C) (Oral)   Resp 20   Ht 5\' 5"  (1.651 m)   Wt 68 kg   LMP 11/26/2017   SpO2 97%   BMI 24.96 kg/m    General: alert and no distress Pulm: normal respiratory effort Lochia: appropriate Abdomen: soft, NT Uterine Fundus: firm, below umbilicus Extremities: No evidence of DVT seen on physical exam. No lower extremity edema.   Disposition: stable, discharge to home Baby Feeding: breastmilk and formula Baby Disposition: inpatient until day 5 due to hx maternal opioid use d/o on suboxone- mom rooming in.   Contraception: Depo-Provera  Prenatal Labs:  Blood type/Rh O+  Antibody screen neg  Rubella Immune  Varicella Immune  RPR NR  HBsAg Neg  HIV NR  GC neg  Chlamydia neg  Genetic screening Low PAPP-A, NT increased, cfDNA negative  1 hour GTT 147  3 hour GTT 75, 145, 142, 123  GBS negative   Rh Immune globulin given: n/a Rubella vaccine given: n/a Tdap vaccine given in AP or PP setting: 06/11/2018 Flu vaccine given in AP or PP setting: 05/17/2018  Plan:  Kerri Wood was discharged to home in good condition. [facility] weeks  Discharge Instructions: Per After Visit Summary. Activity: Advance as tolerated. Pelvic rest for 6 weeks.   Diet: Regular Discharge Medications: Allergies as of 08/31/2018      Reactions   Morphine And Related Hives      Medication List    TAKE these medications   acetaminophen 500 MG tablet Commonly known as:  TYLENOL Take 2 tablets (1,000 mg total) by mouth every 6 (six) hours as needed for fever (for pain scale < 4).   amoxicillin-clavulanate 875-125 MG tablet Commonly known as:  AUGMENTIN Take 1 tablet by mouth every 12 (twelve) hours  for 10 days.   aspirin EC 81 MG tablet Take 81 mg by mouth.   buprenorphine-naloxone 8-2 mg Subl SL tablet Commonly known as:  SUBOXONE Place 1 tablet under the tongue daily.   enoxaparin 40 MG/0.4ML injection Commonly known as:  LOVENOX Inject 0.4 mLs (40 mg total) into the skin every 12 (twelve) hours. What changed:  how much to take   ibuprofen 600 MG tablet Commonly known as:  ADVIL,MOTRIN Take 1 tablet (600 mg total) by mouth every 6 (six) hours.   medroxyPROGESTERone 150 MG/ML injection Commonly known as:  DEPO-PROVERA Inject 1 mL (150 mg total) into the muscle every 3 (three) months. Start taking on:  October 27, 2018   prenatal multivitamin Tabs tablet Take 1 tablet by mouth daily at 12 noon.   senna-docusate 8.6-50 MG tablet Commonly known as:  Senokot-S Take 2 tablets by mouth daily. Start taking on:  September 01, 2018      Outpatient follow up:  Follow-up Information    Kerri DelHaviland, Kerri, CNM. Schedule an appointment as soon as possible for a visit in 6 week(s).   Specialty:  Certified Nurse Midwife Why:  For routine postpartum visit Contact information: [address] [place] [id number] [phone]            Signed: Randa NgoRebecca A Wood, CNM 08/31/2018 11:24 AM

## 2018-08-28 NOTE — Lactation Note (Signed)
Lactation Consultation Note  Patient Name: Kerri Wood Today's Date: 08/28/2018 Reason for consult: Initial assessment   Maternal Data    Feeding Feeding Type: Breast Fed  LATCH Score Latch: Repeated attempts needed to sustain latch, nipple held in mouth throughout feeding, stimulation needed to elicit sucking reflex.  Motehr needed reassurance with breat feeding and holding the baby.  Audible Swallowing: A few with stimulation  Type of Nipple: Everted at rest and after stimulation  Comfort (Breast/Nipple): Soft / non-tender  Hold (Positioning): Assistance needed to correctly position infant at breast and maintain latch.  LATCH Score: 7  Interventions Interventions: Breast feeding basics reviewed;Assisted with latch  Lactation Tools Discussed/Used WIC Program: Yes   Consult Status Consult Status: Follow-up Follow-up type: In-patient    Trudee Grip 08/28/2018, 1:36 PM

## 2018-08-28 NOTE — Lactation Note (Signed)
This note was copied from a baby's chart. Lactation Consultation Note  Patient Name: Kerri Wood MOLMB'E Date: 08/28/2018 Reason for consult: Follow-up assessment   Maternal Data Has patient been taught Hand Expression?: Yes Does the patient have breastfeeding experience prior to this delivery?: No  Feeding Feeding Type: Breast Fed  LATCH Score Latch: Repeated attempts needed to sustain latch, nipple held in mouth throughout feeding, stimulation needed to elicit sucking reflex.  Audible Swallowing: A few with stimulation  Type of Nipple: Flat  Comfort (Breast/Nipple): Soft / non-tender  Hold (Positioning): Assistance needed to correctly position infant at breast and maintain latch.  LATCH Score: 6  Interventions Interventions: Breast feeding basics reviewed  Lactation Tools Discussed/Used WIC Program: Yes     Consult Status Consult Status: PRN    Trudee Grip 08/28/2018, 3:40 PM

## 2018-08-29 LAB — CBC
HCT: 28.5 % — ABNORMAL LOW (ref 36.0–46.0)
Hemoglobin: 9.5 g/dL — ABNORMAL LOW (ref 12.0–15.0)
MCH: 27.4 pg (ref 26.0–34.0)
MCHC: 33.3 g/dL (ref 30.0–36.0)
MCV: 82.1 fL (ref 80.0–100.0)
Platelets: 275 10*3/uL (ref 150–400)
RBC: 3.47 MIL/uL — ABNORMAL LOW (ref 3.87–5.11)
RDW: 13.2 % (ref 11.5–15.5)
WBC: 21.5 10*3/uL — ABNORMAL HIGH (ref 4.0–10.5)
nRBC: 0 % (ref 0.0–0.2)

## 2018-08-29 LAB — INFLUENZA PANEL BY PCR (TYPE A & B)
Influenza A By PCR: NEGATIVE
Influenza B By PCR: NEGATIVE

## 2018-08-29 MED ORDER — ACETAMINOPHEN 500 MG PO TABS
1000.0000 mg | ORAL_TABLET | Freq: Four times a day (QID) | ORAL | Status: DC | PRN
  Administered 2018-08-29 – 2018-08-30 (×3): 1000 mg via ORAL
  Filled 2018-08-29 (×3): qty 2

## 2018-08-29 MED ORDER — AMOXICILLIN-POT CLAVULANATE 875-125 MG PO TABS
1.0000 | ORAL_TABLET | Freq: Two times a day (BID) | ORAL | Status: DC
  Administered 2018-08-29 – 2018-08-31 (×4): 1 via ORAL
  Filled 2018-08-29 (×5): qty 1

## 2018-08-29 NOTE — Progress Notes (Signed)
Post Partum Day 1 Subjective: Tolerating regular diet, pain with PO meds, voiding and ambulating without difficulty. Continues to have intermittent fever, c/o chills and tenderness when uterus is "rubbed"   No CP SOB, N/V or leg pain; denies nipple or breast pain, no HA change of vision, RUQ/epigastric pain  Objective: BP (!) 89/55 (BP Location: Left Arm) Comment: nurse Lavella Lemons notified  Pulse 93   Temp 98 F (36.7 C) (Oral)   Resp 16   Ht 5\' 5"  (1.651 m)   Wt 68 kg   LMP 11/26/2017   SpO2 97%   BMI 24.96 kg/m    Physical Exam:  General: NAD Breasts: soft/nontender CV: RRR Pulm: nl effort, CTABL Abdomen: soft, NT, BS x 4 Perineum: minimal edema, laceration repair well approximated Lochia: small Uterine Fundus: fundus firm and 2 fb below umbilicus, generalized fundal tenderness on exam.  DVT Evaluation: no cords, ttp LEs   Recent Labs    08/27/18 0143 08/29/18 0643  HGB 11.9* 9.5*  HCT 36.0 28.5*  WBC 9.1 21.5*  PLT 287 275    Assessment/Plan: 33 y.o. G2P1001 postpartum day # 1  - Continue routine PP care - Lactation consult prn.  - Acute blood loss anemia - hemodynamically stable and asymptomatic; start po ferrous sulfate BID with stool softeners  - d/w Dr Dalbert Garnet, fundal tenderness, intermittent fevers with body aches and chills. Negative flu swabs. Will treat for endometritis, pt has been very difficult to get IV access, will start with PO Augmentin, repeat CBC in am. - continue Suboxone and lovenox as ordered.      Disposition: Does not desire Dc home today.     Randa Ngo, CNM 08/29/2018  6:38 PM

## 2018-08-29 NOTE — Clinical Social Work Maternal (Signed)
  CLINICAL SOCIAL WORK MATERNAL/CHILD NOTE  Patient Details  Name: Kerri Wood MRN: [id number] Date of Birth: 1985-09-15  Date:  08/29/2018  Clinical Social Worker Initiating Note:  Annamaria Boots LCSWA  Date/Time: Initiated:  08/29/18/1652     Child's Name:      Biological Parents:  Mother, Father   Need for Interpreter:  None   Reason for Referral:      Address:  [place] [address]    Phone number:  [phone] (home)     Additional phone number:   Household Members/Support Persons (HM/SP):   Household Member/Support Person 1   HM/SP Name Relationship DOB or Age  HM/SP -1 Coty Searls Spouse unknown  HM/SP -2        HM/SP -3        HM/SP -4        HM/SP -5        HM/SP -6        HM/SP -7        HM/SP -8          Natural Supports (not living in the home):  Immediate Family, Friends   Chiropodist: None   Employment: Unemployed   Type of Work:     Education:  Programmer, systems   Homebound arranged:    Museum/gallery curator Resources:  Medicaid   Other Resources:  [facility]   Cultural/Religious Considerations Which May Impact Care:    Strengths:  Ability to meet basic needs , Home prepared for child , Pediatrician chosen   Psychotropic Medications:         Pediatrician:    [employer]  Pediatrician List:   [facility] Other  [facility]      Pediatrician Fax Number:    Risk Factors/Current Problems:  None   Cognitive State:  Alert    Mood/Affect:  Calm    CSW Assessment: CSW consulted due to patient not having custody of 31 year old child. CSW met with patient and spouse (FOB) in room. FOB was holding baby during interview and patient was in bed grimacing from pain. CSW explained reason for visit. Patient reports that this is her second child. CSW asked about older child and patient reports that her first born child is 53 years old but she  does not have custody of her because the child's father "took her". Patient reports that there is not a legal custody for her and the father currently has the child. Patient reports that she is no longer involved with the previous father and is now with her current spouse which is baby's father. Patient does not work but her spouse does. She also has Medicaid and WIC and food stamps. Patient reports that she has chosen a Lexicographer but could not remember the name of it. Patient reports that she does not use any alcohol or drugs. Patient reports that she has all necessary items for the baby (car seat, clothes, formula, etc.) Patient has a strong support system and is able to meet basic needs. No further intervention needed at this time.  CSW Plan/Description:  No Further Intervention Required/No Barriers to Discharge    Sissy Hoff 08/29/2018, 5:12 PM

## 2018-08-29 NOTE — Anesthesia Postprocedure Evaluation (Deleted)
Anesthesia Post Note  Patient: Kerri Wood  Procedure(s) Performed: AN AD HOC LABOR EPIDURAL  Patient location during evaluation: Women's Unit Anesthesia Type: General Level of consciousness: awake and awake and alert Pain management: pain level controlled Vital Signs Assessment: post-procedure vital signs reviewed and stable Respiratory status: spontaneous breathing Cardiovascular status: blood pressure returned to baseline Postop Assessment: no headache Anesthetic complications: no     Last Vitals:  Vitals:   08/29/18 0509 08/29/18 0605  BP: 111/74   Pulse: (!) 110   Resp:    Temp: (!) 38.4 C 37.7 C  SpO2: 96%     Last Pain:  Vitals:   08/29/18 0605  TempSrc: Oral  PainSc:                  Vernie Murders

## 2018-08-29 NOTE — Lactation Note (Signed)
This note was copied from a baby's chart. Lactation Consultation Note  Patient Name: Boy Jordanna Tagawa EGBTD'V Date: 08/29/2018  Mom had flu symptoms but negative flu, wants to bottle and breast, has breastfed x 2 today that I did not observe, has bottle fed other feedings, she states she didn't feel well, states it hurts, recently fed, but I offered to observe a feeding as baby is sucking on a pacifier, mom did not want to do this as she just fed baby formula, I encouraged her to call for assistance with next breastfeeding and to ask for coconut oil to use for nipple tenderness    Maternal Data    Feeding Feeding Type: Breast Fed  [facility] Score                   Interventions    Lactation Tools Discussed/Used     Consult Status      Dyann Kief 08/29/2018, 7:55 PM

## 2018-08-29 NOTE — Progress Notes (Signed)
Patient running fever off/on today and reports chills and body aches--Flu swab ordered by provider but results were negative. Provider starting on antibiotic prophylactic. Taking tylenol and motrin for pain. Bonding well with infant--social consult completed today.

## 2018-08-29 NOTE — Anesthesia Postprocedure Evaluation (Signed)
Anesthesia Post Note  Patient: Kerri Wood  Procedure(s) Performed: AN AD HOC LABOR EPIDURAL  Moving all extremities, has emptied bladder, no complaints.  Satisfied with Epidural.  Alert and oriented.  Last Vitals:  Vitals:   08/29/18 0509 08/29/18 0605  BP: 111/74   Pulse: (!) 110   Resp:    Temp: (!) 38.4 C 37.7 C  SpO2: 96%     Last Pain:  Vitals:   08/29/18 0605  TempSrc: Oral  PainSc:                  Vernie Murders

## 2018-08-30 LAB — CBC
HCT: 28.6 % — ABNORMAL LOW (ref 36.0–46.0)
Hemoglobin: 9.3 g/dL — ABNORMAL LOW (ref 12.0–15.0)
MCH: 27.5 pg (ref 26.0–34.0)
MCHC: 32.5 g/dL (ref 30.0–36.0)
MCV: 84.6 fL (ref 80.0–100.0)
Platelets: 313 10*3/uL (ref 150–400)
RBC: 3.38 MIL/uL — ABNORMAL LOW (ref 3.87–5.11)
RDW: 13.2 % (ref 11.5–15.5)
WBC: 34.9 10*3/uL — ABNORMAL HIGH (ref 4.0–10.5)
nRBC: 0 % (ref 0.0–0.2)

## 2018-08-30 MED ORDER — ENOXAPARIN SODIUM 40 MG/0.4ML ~~LOC~~ SOLN
40.0000 mg | Freq: Two times a day (BID) | SUBCUTANEOUS | Status: DC
  Administered 2018-08-30 – 2018-08-31 (×2): 40 mg via SUBCUTANEOUS
  Filled 2018-08-30 (×2): qty 0.4

## 2018-08-30 NOTE — Progress Notes (Signed)
Patient complaining of sore nipples. RN assessed patient breast/nipples. Patient nipples blistered and bleeding. Lactation consulted to help with latch and pain. Mom given coconut oil and comfort gels. Nipple shield given by LC. Patient states nipple shield "made a world of difference." Patient breast and formula feeding. RN to continue to monitor.

## 2018-08-30 NOTE — Progress Notes (Signed)
Social worker saw patient yesterday about patient not having custody of 31 year old. RN spoke with weekend Social Worker this morning, per Pediatrician (Dr. Dierdre Highman) about them assessing history of drug use and patient being on suboxone. OB provider Bonnielee Haff) also aware. Social worker states patient is not a CPS case due to negative UDS for patient and infant on admission, and non-custody of 54 year old not due to CPS reasons. Social worker to put in note.

## 2018-08-30 NOTE — Progress Notes (Signed)
Post Partum Day 2 Subjective: Doing well, no complaints.  Tolerating regular diet, pain with PO meds, voiding and ambulating without difficulty. Feeling much better today, nipples are more sore and cracked now.   No CP SOB Fever,Chills, N/V or leg pain;  no HA change of vision, RUQ/epigastric pain  Objective: BP 98/68 (BP Location: Right Arm)   Pulse 84   Temp (!) 97.5 F (36.4 C) (Oral)   Resp 18   Ht 5\' 5"  (1.651 m)   Wt 68 kg   LMP 11/26/2017   SpO2 99%   BMI 24.96 kg/m    Physical Exam:  General: NAD Breasts: breasts soft/nontender, nipple abrasions bilaterally.  CV: RRR Pulm: nl effort, CTABL Abdomen: soft, NT, BS x 4 Perineum: minimal edema, lacerations hemostatic/repair well approximated Lochia: small Uterine Fundus: fundus firm and 2 fb below umbilicus, Nontender today.  DVT Evaluation: no cords, ttp LEs   Recent Labs    08/29/18 0643 08/30/18 0520  HGB 9.5* 9.3*  HCT 28.5* 28.6*  WBC 21.5* 34.9*  PLT 275 313    Assessment/Plan: 32 y.o. G2P1001 postpartum day # 2 with PP endometritis/leukocytosis  - Continue routine PP care - Lactation consult prn  - continue Lovenox-per MFM recs in note dated 04/03/18, pt to be on lovenox 40mg  BID until 6 weeks PP. Will change ordered dose to 40mg  BID.   - continue suboxone for tx opioid use d/o - anemia - hemodynamically stable and asymptomatic; continue po ferrous sulfate BID with stool softeners  - endometritis- t max 101.8 1/17 at 1024, WBCs rising with this am CBC. D/w Dr Elesa Massed, continue augmentin, add differential onto this am CBC. Repeat CBC/diff in am.  - Immunization status: all Imms up to date    Disposition: Does not desire Dc home today.     Randa Ngo, CNM 08/30/2018  8:49 AM

## 2018-08-30 NOTE — Clinical Social Work Note (Signed)
The CSW received a call from the attending RN about concerns related to the patient's hx of suboxone prescription. The CSW advised that because the infant and the MOB are negative for substances, no mandated report can be made. The patient's 31 YO is in the custody of the FOB but not through CPS or DSS involvement.   Argentina PonderKaren Martha Brenen Beigel, MSW, Theresia MajorsLCSWA [phone]

## 2018-08-30 NOTE — Lactation Note (Addendum)
This note was copied from a baby's chart. Lactation Consultation Note  Patient Name: Kerri Wood HWEXH'B Date: 08/30/2018 Reason for consult: Follow-up assessment;Primapara;Term;Other (Comment)(Eat, sleep, console baby)  Had been in room early to assist with breast feed.  Mom was having severe discomfort during entire breast feed.  Mom's nipples were cracked and painful.  She reports they were bleeding earlier from open area.  Hand expressed colostrum without any bleeding.  Mom had used manual pump earlier and reported pumping made them worse.  Encouraged mom to use electric pump if wanted to pump again and flange size increased to #27.  Mom had me feel in breast because felt like a hole in breast per mom.  Mom's breasts were beginning to feel a little fuller, but no hole palpated.  Had her palpate on other breast in same area and felt the same way explaining that was normal.  Encouraged mom to feed whenever Montez Morita demonstrated feeding cues to bring in mature milk, ensure she had a plentiful supply and prevent engorgement.  Encouraged mom to call when Montez Morita demonstrated feeding cues.  When went back in room to check on her she had given bottle of formula.  On this visit, Montez Morita was crying when entered room and mom was just putting a pacifier in his mouth.  Encouraged mom to put him back to the breast, only this time using a #20 nipple shield.  Assisted mom with latching him to right breast in comfortable football position with pillow support, but even with nipple shield he continued to fuss and push away from breast.  Put a little formula in tip of nipple shield and relatched.  He began strong rhythmic sucking with occasional swallows.  When he would quit sucking for long interval, a small amount of formula was given via curved tip syringe along side of nipple shield to keep him actively sucking and swallowing at the breast, giving a total of 8 ml of formula.  Demonstrated breast massage while breast  feeding.  Mom reports it was still uncomfortable probably from the trauma already to the nipple, but much better with nipple shield than without it.  Carter sucked periodically for 15 minutes.  When he came off the breast, there was colostrum in nipple shield.  Hand out given and reviewed supply and demand, normal course of lactation and routine newborn feeding patterns.  Lactation name and number is on white board and encouraged to call with any questions, concerns or assistance.    Maternal Data Formula Feeding for Exclusion: No Has patient been taught Hand Expression?: Yes Does the patient have breastfeeding experience prior to this delivery?: No  Feeding Feeding Type: Breast Fed Nipple Type: Slow - flow  LATCH Score Latch: Repeated attempts needed to sustain latch, nipple held in mouth throughout feeding, stimulation needed to elicit sucking reflex.  Audible Swallowing: A few with stimulation  Type of Nipple: Everted at rest and after stimulation  Comfort (Breast/Nipple): Filling, red/small blisters or bruises, mild/mod discomfort  Hold (Positioning): Assistance needed to correctly position infant at breast and maintain latch.  LATCH Score: 6  Interventions Interventions: Breast feeding basics reviewed;Assisted with latch;Breast massage;Hand express;Reverse pressure;Breast compression;Adjust position;Support pillows;Position options;Coconut oil;Comfort gels;Hand pump  Lactation Tools Discussed/Used Tools: Flanges;Coconut oil;Comfort gels;Nipple Dorris Carnes;Other (comment)(Curved tip syringe) Nipple shield size: 20 Flange Size: 27 WIC Program: Yes Pump Review: Setup, frequency, and cleaning;Milk Storage;Other (comment) Date initiated:: 08/30/18   Consult Status Consult Status: Follow-up Date: 08/30/18 Follow-up type: Call as needed  Louis Meckel 08/30/2018, 5:04 PM

## 2018-08-30 NOTE — Anesthesia Postprocedure Evaluation (Signed)
Anesthesia Post Note  Patient: Transport planner  Procedure(s) Performed: AN AD HOC LABOR EPIDURAL  Patient location during evaluation: Mother Baby Anesthesia Type: General Level of consciousness: awake and alert Pain management: pain level controlled Vital Signs Assessment: post-procedure vital signs reviewed and stable Respiratory status: spontaneous breathing, nonlabored ventilation and respiratory function stable Cardiovascular status: stable Postop Assessment: no headache, no backache and epidural receding Anesthetic complications: no     Last Vitals:  Vitals:   08/30/18 0755 08/30/18 1527  BP: 98/68 103/66  Pulse: 84 94  Resp: 18 18  Temp: (!) 36.4 C 36.7 C  SpO2: 99% 99%    Last Pain:  Vitals:   08/30/18 1557  TempSrc:   PainSc: 0-No pain                 Yevette Edwards

## 2018-08-31 ENCOUNTER — Ambulatory Visit: Payer: Self-pay

## 2018-08-31 LAB — CBC WITH DIFFERENTIAL/PLATELET
Abs Immature Granulocytes: 0.37 10*3/uL — ABNORMAL HIGH (ref 0.00–0.07)
Abs Immature Granulocytes: 0.19 10*3/uL — ABNORMAL HIGH (ref 0.00–0.07)
Basophils Absolute: 0.1 10*3/uL (ref 0.0–0.1)
Basophils Absolute: 0 10*3/uL (ref 0.0–0.1)
Basophils Relative: 0 %
Basophils Relative: 0 %
EOS ABS: 0.1 10*3/uL (ref 0.0–0.5)
Eosinophils Absolute: 0.1 10*3/uL (ref 0.0–0.5)
Eosinophils Relative: 0 %
Eosinophils Relative: 1 %
HCT: 26.1 % — ABNORMAL LOW (ref 36.0–46.0)
HCT: 23.9 % — ABNORMAL LOW (ref 36.0–46.0)
Hemoglobin: 8.8 g/dL — ABNORMAL LOW (ref 12.0–15.0)
Hemoglobin: 8.1 g/dL — ABNORMAL LOW (ref 12.0–15.0)
Immature Granulocytes: 2 %
Immature Granulocytes: 1 %
LYMPHS PCT: 11 %
Lymphocytes Relative: 13 %
Lymphs Abs: 2.7 10*3/uL (ref 0.7–4.0)
Lymphs Abs: 2.6 10*3/uL (ref 0.7–4.0)
MCH: 27.8 pg (ref 26.0–34.0)
MCH: 27.8 pg (ref 26.0–34.0)
MCHC: 33.7 g/dL (ref 30.0–36.0)
MCHC: 33.9 g/dL (ref 30.0–36.0)
MCV: 82.3 fL (ref 80.0–100.0)
MCV: 82.1 fL (ref 80.0–100.0)
Monocytes Absolute: 0.7 10*3/uL (ref 0.1–1.0)
Monocytes Absolute: 0.8 10*3/uL (ref 0.1–1.0)
Monocytes Relative: 3 %
Monocytes Relative: 4 %
NEUTROS ABS: 21.5 10*3/uL — AB (ref 1.7–7.7)
NEUTROS PCT: 84 %
Neutro Abs: 15.7 10*3/uL — ABNORMAL HIGH (ref 1.7–7.7)
Neutrophils Relative %: 81 %
Platelets: 346 10*3/uL (ref 150–400)
Platelets: 380 10*3/uL (ref 150–400)
RBC: 3.17 MIL/uL — ABNORMAL LOW (ref 3.87–5.11)
RBC: 2.91 MIL/uL — ABNORMAL LOW (ref 3.87–5.11)
RDW: 13.3 % (ref 11.5–15.5)
RDW: 13.2 % (ref 11.5–15.5)
Smear Review: NORMAL
WBC: 25.3 10*3/uL — ABNORMAL HIGH (ref 4.0–10.5)
WBC: 19.3 10*3/uL — ABNORMAL HIGH (ref 4.0–10.5)
nRBC: 0 % (ref 0.0–0.2)
nRBC: 0 % (ref 0.0–0.2)

## 2018-08-31 MED ORDER — MEDROXYPROGESTERONE ACETATE 150 MG/ML IM SUSP
150.0000 mg | INTRAMUSCULAR | Status: DC
  Administered 2018-08-31: 150 mg via INTRAMUSCULAR
  Filled 2018-08-31: qty 1

## 2018-08-31 MED ORDER — SENNOSIDES-DOCUSATE SODIUM 8.6-50 MG PO TABS: 2.0000 | ORAL_TABLET | ORAL | Status: AC

## 2018-08-31 MED ORDER — AMOXICILLIN-POT CLAVULANATE 875-125 MG PO TABS: 1.0000 | ORAL_TABLET | Freq: Two times a day (BID) | ORAL | 0 refills | Status: AC

## 2018-08-31 MED ORDER — ENOXAPARIN SODIUM 40 MG/0.4ML ~~LOC~~ SOLN: 40.0000 mg | Freq: Two times a day (BID) | SUBCUTANEOUS | 0 refills | Status: AC

## 2018-08-31 MED ORDER — IBUPROFEN 600 MG PO TABS: 600.0000 mg | ORAL_TABLET | Freq: Four times a day (QID) | ORAL | 0 refills | Status: DC

## 2018-08-31 MED ORDER — MEDROXYPROGESTERONE ACETATE 150 MG/ML IM SUSP: 150.0000 mg | INTRAMUSCULAR | 2 refills | Status: AC

## 2018-08-31 MED ORDER — ACETAMINOPHEN 500 MG PO TABS: 1000.0000 mg | ORAL_TABLET | Freq: Four times a day (QID) | ORAL | 0 refills | Status: AC | PRN

## 2018-08-31 NOTE — Progress Notes (Signed)
Post Partum Day 3  Subjective: Doing well, no complaints.  Tolerating regular diet, pain with PO meds, voiding and ambulating without difficulty. Improved nipple pain with use of nipple shield and electric pump   No CP SOB Fever,Chills, N/V or leg pain;  no HA change of vision, RUQ/epigastric pain  Objective: BP 113/75 (BP Location: Right Arm)   Pulse 92   Temp 98.5 F (36.9 C) (Oral)   Resp 20   Ht 5\' 5"  (1.651 m)   Wt 68 kg   LMP 11/26/2017   SpO2 97%   BMI 24.96 kg/m    Physical Exam:  General: NAD Breasts: breasts filling, nipple abrasions improved.  CV: RRR Pulm: nl effort, CTABL Abdomen: soft, NT, BS x 4 Perineum: minimal edema, laceration hemostatic and repair well approximated Lochia: moderate Uterine Fundus: fundus firm and 2 fb below umbilicus; remains non-tender DVT Evaluation: no cords, ttp LEs   Recent Labs    08/30/18 0520 08/31/18 0614  HGB 9.3* 8.8*  HCT 28.6* 26.1*  WBC 34.9* 25.3*  PLT 313 346    Assessment/Plan: 33 y.o. 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 Repeat CBC/diff in 12hrs prior to DC this eve. - Immunization status: all Imms up to date    Disposition: Does desire Dc home today.     Randa Ngo, CNM 08/31/2018  9:21 AM

## 2018-08-31 NOTE — Progress Notes (Addendum)
Discharge instructions given. Patient verbalizes understanding of teaching. Patient to be discharged but will room in with [facility] infant. Rooming in instructions given to mom. Mom to still be able to order food per order in infant's chart. Mom to also be responsible for any medication. Next dose times for medications given to mom. Mom verbalizes understanding.

## 2018-08-31 NOTE — Lactation Note (Signed)
This note was copied from a baby's chart. Lactation Consultation Note  Patient Name: Kerri Wood Today's Date: 08/31/2018   Encouraged mom to try and put Kerri Wood back on the breast using nipple shield.  Mom reports being too sore to breast feed right now but willing to pump and give breast milk and let FOB bottle feed.  Mom pumped and got 25 ml pretty quickly.  Kerri Wood was screaming so encouraged FOB to go ahead and give 25 ml that she had already pumped to baby and let mom continue to pump because breasts are still firm, which he did.  Mom got another 30 ml while continuing to pump which she gave at next feeding.  Encouraged mom to pump 8 to 12 times in 24 hours to prevent engorgement.  Lactation name and number on white board and encouraged to call with any questions, concerns or assistance.  Maternal Data Formula Feeding for Exclusion: Yes Reason for exclusion: Substance abuse and/or alcohol abuse  Feeding Feeding Type: Bottle Fed - Breast Milk(bottle fed formula per mom)  LATCH Score                   Interventions    Lactation Tools Discussed/Used     Consult Status      Louis Meckel 08/31/2018, 9:38 PM

## 2018-09-01 ENCOUNTER — Ambulatory Visit: Payer: Self-pay

## 2018-09-01 NOTE — Lactation Note (Signed)
This note was copied from a baby's chart. Lactation Consultation Note  Patient Name: Kerri Wood [id number] Date: 09/01/2018 Reason for consult: Follow-up assessment;Mother's request;Term;Other (Comment)(Eat, Sleep, Console baby)  Mom's breasts were hard, lumpy, painful and warm to touch when visited her this am.  Mom not wanting to put Montez Morita to the breast.  Encouraged mom to massage, hand express and pump frequently or at least 8 to 12 times in 24 hours to relieve engorgement.  By 7 pm mom's breasts were softer and comfortable and mom is pumping from 2 oz this am to 4 oz now.  FOB still feeding formula as well.  Encouraged mom to just give expressed breast milk.  Referral faxed to [facility] for mom to have access to DEBP.  Will have lactation follow up tomorrow.  Lactation name and number on white board and encouraged to call with any questions, concerns or assistance. Maternal Data Formula Feeding for Exclusion: No Reason for exclusion: (Not exclusively giving formula - giving breastmilk as well) Has patient been taught Hand Expression?: Yes Does the patient have breastfeeding experience prior to this delivery?: No  Feeding Feeding Type: Bottle Fed - Breast Milk Nipple Type: Slow - flow  LATCH Score                   Interventions Interventions: Hand express;Reverse pressure;Expressed milk;Coconut oil;Comfort gels;DEBP  Lactation Tools Discussed/Used Tools: Pump;Flanges;Coconut oil;Comfort gels;Bottle Nipple shield size: 20 Flange Size: 27 Breast pump type: Double-Electric Breast Pump WIC Program: Yes Pump Review: Setup, frequency, and cleaning;Milk Storage;Other (comment) Initiated by:: Raeanne Gathers Date initiated:: 08/30/18   Consult Status Consult Status: Follow-up Follow-up type: Call as needed    Louis Meckel 09/01/2018, 9:29 PM

## 2020-05-07 IMAGING — US US MFM OB COMPLETE +14 WKS
1 series · 13 of 28 positions shown · non-contrast
Comparison: none

PATIENT INFO:

PERFORMED BY:
VAN OORDT
SERVICE(S) PROVIDED:
INDICATIONS:
13 weeks gestation of pregnancy
First trimester screen
FETAL EVALUATION:
Num Of Fetuses:     1
Fetal Heart         153
Rate(bpm):
Cardiac Activity:   Present
Presentation:       Breech , Variable
Placenta:           Posterior
P. Cord Insertion:  Normal
Amniotic Fluid
AFI FV:      Within normal limits
BIOMETRY:
CRL:      68.2  mm     G. Age:  13w 1d                  EDD:   09/03/18
GESTATIONAL AGE:
LMP:           13w 2d        Date:  11/26/17                 EDD:   09/02/18
Best:          13w 2d     Det. By:  LMP  (11/26/17)          EDD:   09/02/18
1ST TRIMESTER GENETIC SONOGRAM SCREENING:
Nuc Trans:       2.3  mm
ANATOMY:
Cranium:               Within Normal Limits   Bladder:                Seen
Choroid Plexus:        Within normal limits   Upper Extremities:      Visualized
for gestational age
Stomach:               Seen                   Lower Extremities:      Visualized
Abdominal Wall:        Within normal limits

[Series 1: us mfm ob complete +14 wks · 0.16mm/px · 13 of 69 slices shown]
[im 3/69]
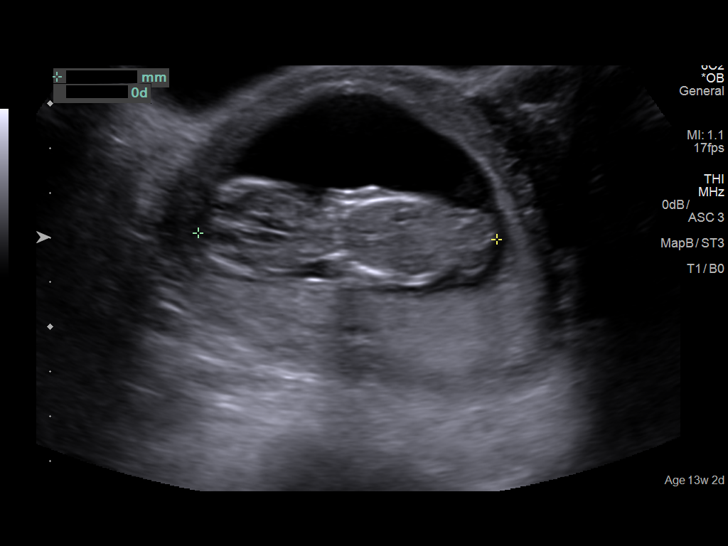
[im 8/69]
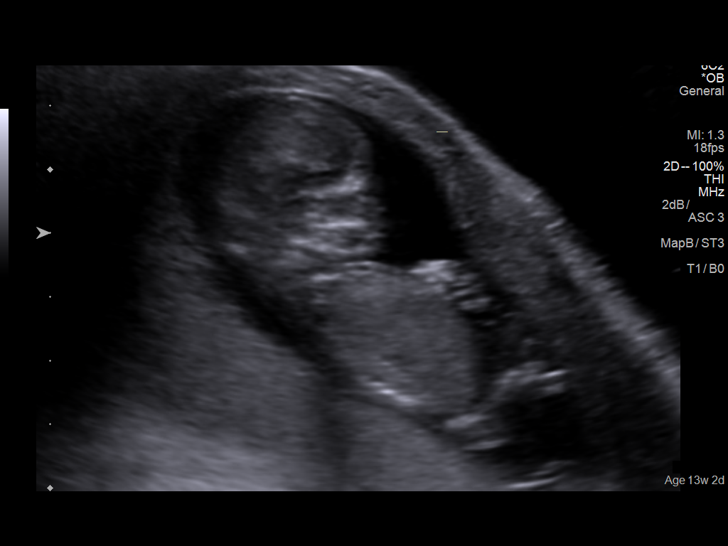
[im 13/69]
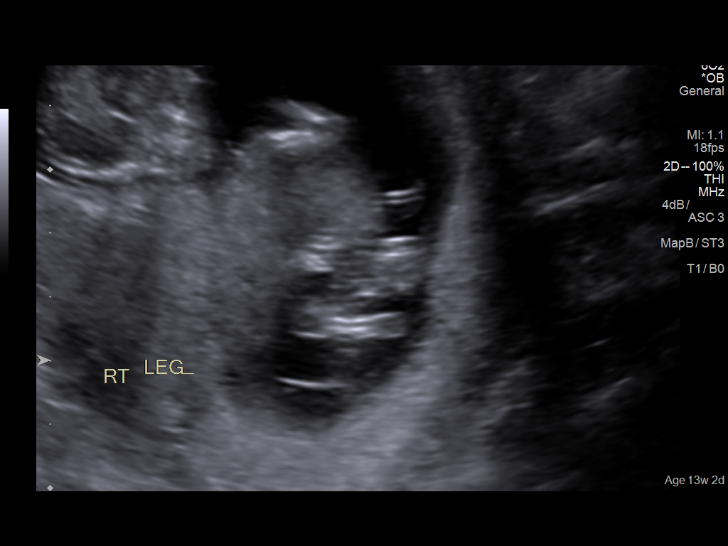
[im 18/69]
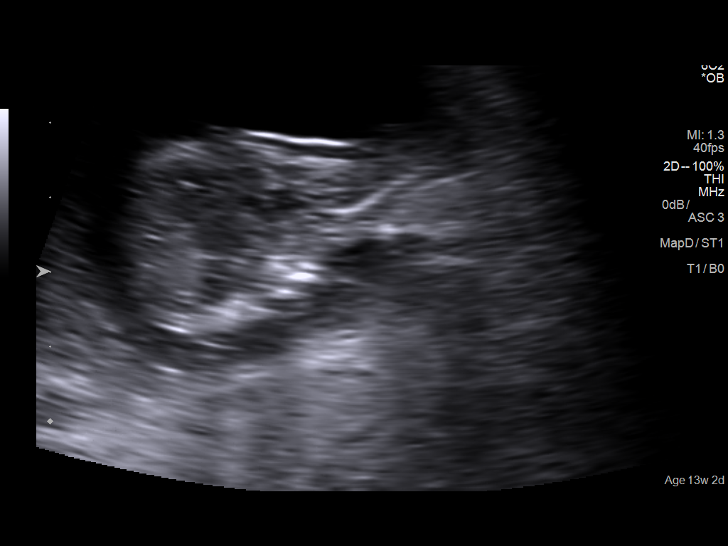
[im 23/69]
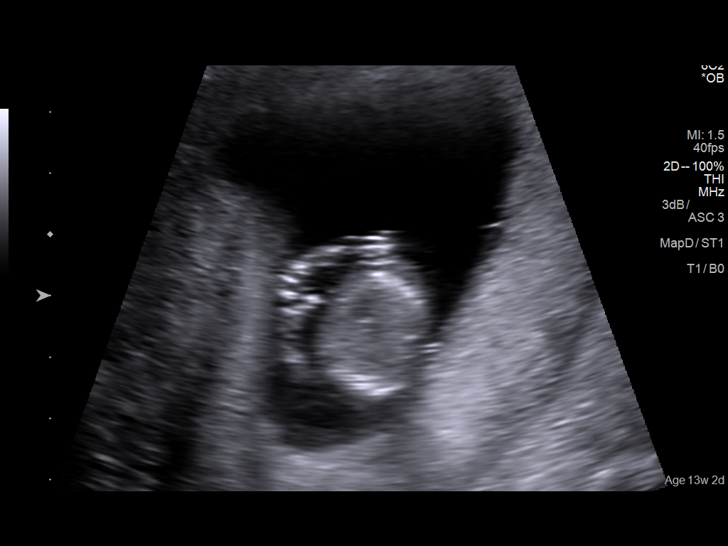
[im 28/69]
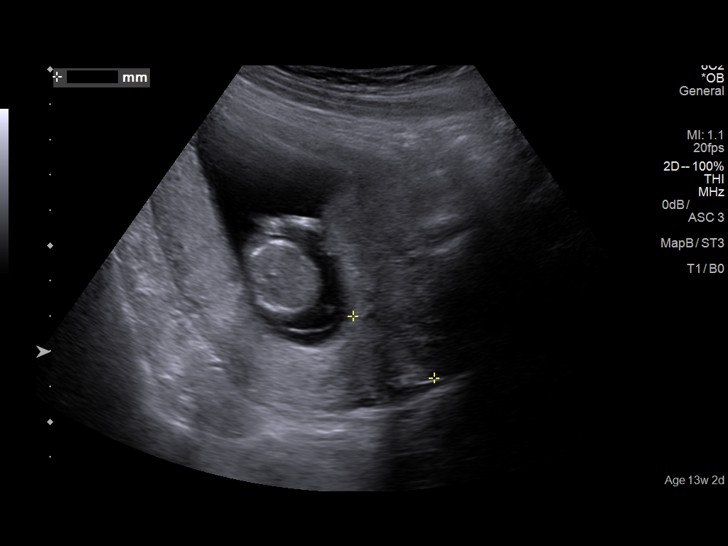
[im 36/69]
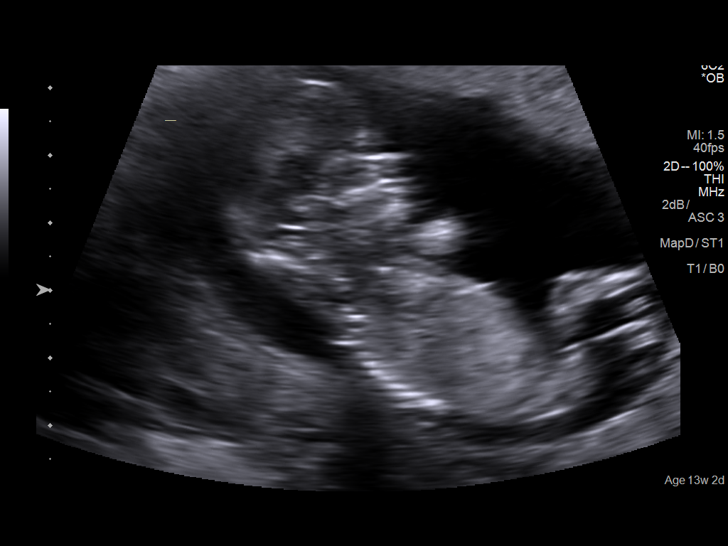
[im 41/69]
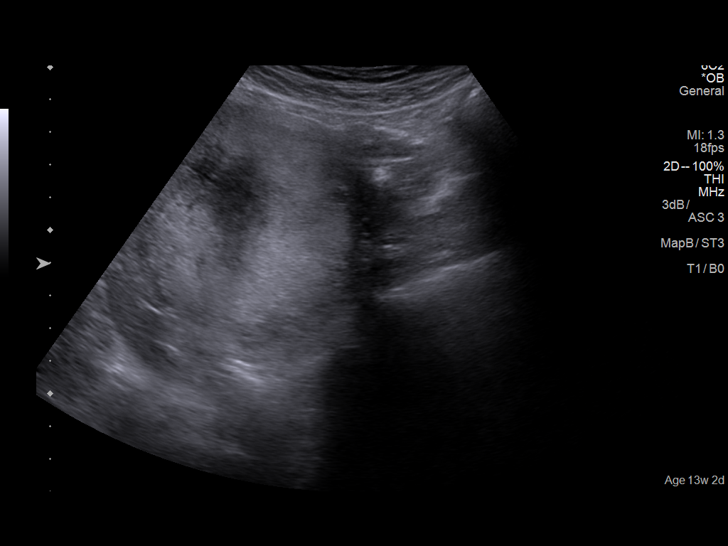
[im 46/69]
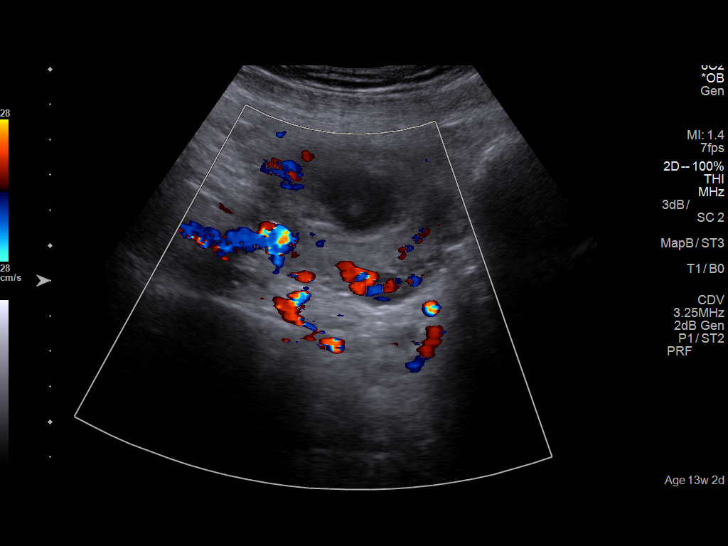
[im 51/69]
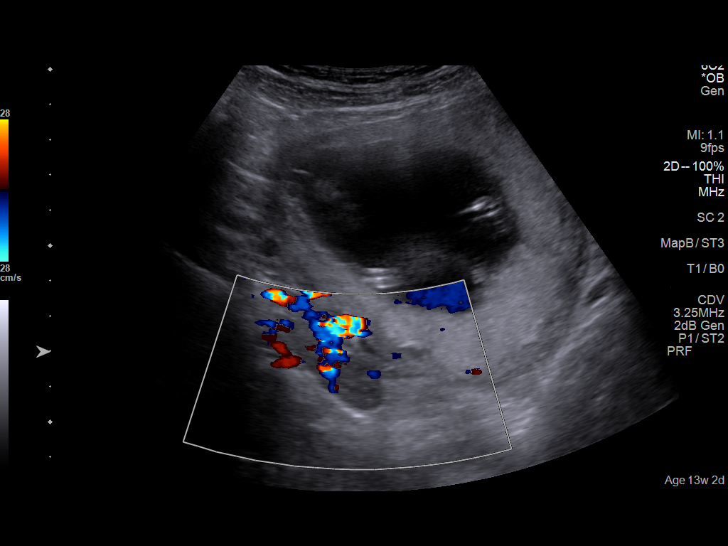
[im 56/69]
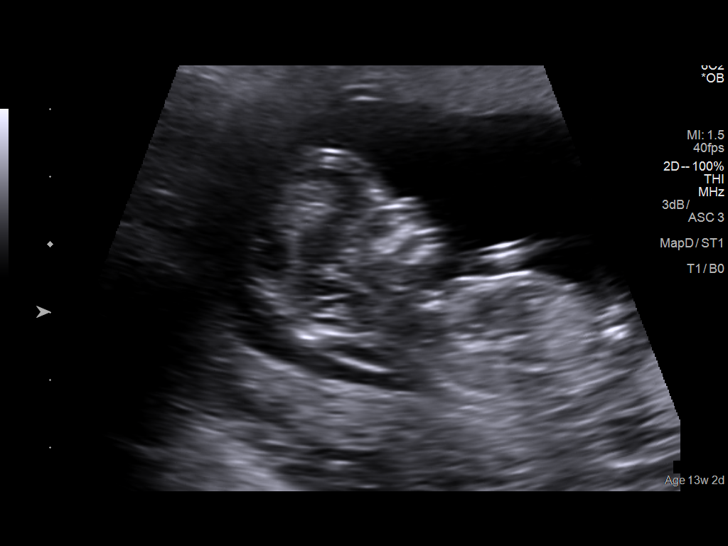
[im 61/69]
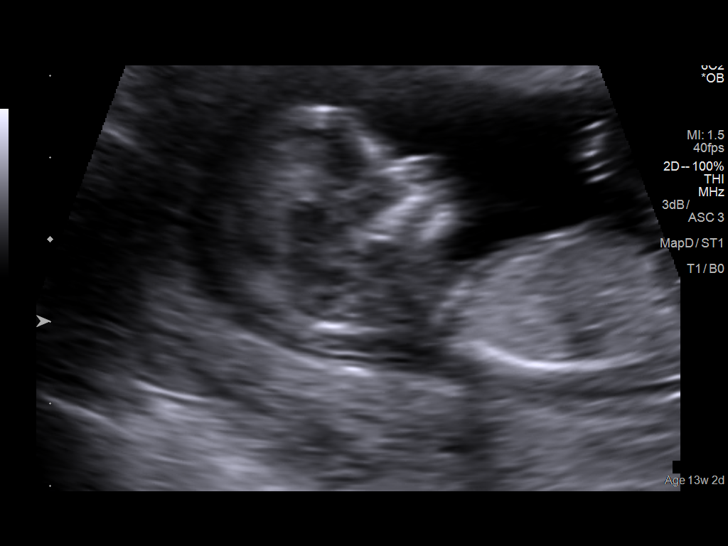
[im 66/69]
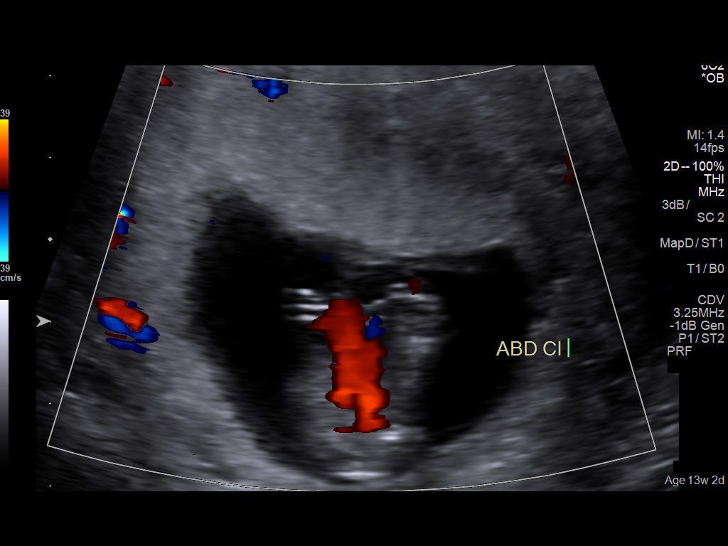

[13 of 28 positions shown; findings below may reference images not displayed]

IMPRESSION: Dear Dr. VAN OORDT,

Thank you for referring your patient to [facility] for first
trimester screening.

The patient met with our genetic counselor and decided to
proceed.

A singleton gestation is noted at 24w5d gestation by LMP
and ultrasound today.

The fetal biometry correlates with established dating.

The nuchal translucency measured  2.3 mm.

The fetal nasal bone was noted to be present.

The ovaries were WNL.

The patient had blood drawn for first trimester screening.  A
composite risk incorporating her age, nuchal translucency
measurement and blood results should be returned to your
office shortly.

RECOMMENDATIONS:

In the course of the patient's discussion with our genetic
counselor, the patient relayed that she had had a
spontaneous blood clot in the past that was treated with ASA.

A review of her [facility] record revealed that she had had an
occlusive saphenous vein thrombosis diagnosed 09/08/2017.

I briefly discussed the increased risk for DVT conferred by a
history of superficial vein thrombosis (SVT).  I recommended
she start low-dose ASA (not coated) 81 mg per day and see
me 03/13/2018 for recommendations for fetal surveillance,
thrombophilia testing and postpartum thromboprophylaxis.

She was also scheduled for an anatomy US in 5 weeks.

## 2020-08-03 ENCOUNTER — Emergency Department
Admission: EM | Admit: 2020-08-03 | Discharge: 2020-08-03 | Disposition: A | Discharge status: Home / Self Care | Payer: Medicaid Other | Attending: Emergency Medicine | Admitting: Emergency Medicine

## 2020-08-03 ENCOUNTER — Encounter: Payer: Self-pay | Admitting: Emergency Medicine

## 2020-08-03 ENCOUNTER — Emergency Department: Payer: Medicaid Other

## 2020-08-03 ENCOUNTER — Other Ambulatory Visit: Payer: Self-pay

## 2020-08-03 DIAGNOSIS — S8991XA Unspecified injury of right lower leg, initial encounter: Secondary | ICD-10-CM | POA: Diagnosis present

## 2020-08-03 DIAGNOSIS — Z7982 Long term (current) use of aspirin: Secondary | ICD-10-CM | POA: Insufficient documentation

## 2020-08-03 DIAGNOSIS — Y92410 Unspecified street and highway as the place of occurrence of the external cause: Secondary | ICD-10-CM | POA: Insufficient documentation

## 2020-08-03 DIAGNOSIS — S8011XA Contusion of right lower leg, initial encounter: Secondary | ICD-10-CM | POA: Diagnosis not present

## 2020-08-03 DIAGNOSIS — Z87891 Personal history of nicotine dependence: Secondary | ICD-10-CM | POA: Diagnosis not present

## 2020-08-03 MED ORDER — HYDROCODONE-ACETAMINOPHEN 5-325 MG PO TABS: 1.0000 | ORAL_TABLET | Freq: Four times a day (QID) | ORAL | 0 refills | Status: AC | PRN

## 2020-08-03 MED ORDER — HYDROCODONE-ACETAMINOPHEN 5-325 MG PO TABS
1.0000 | ORAL_TABLET | Freq: Once | ORAL | Status: AC
  Administered 2020-08-03: 1 via ORAL
  Filled 2020-08-03: qty 1
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
## 2020-08-03 NOTE — Discharge Instructions (Signed)
Follow-up with your primary care provider if any continued problems or concerns.  Ice and elevation to help with pain and if there is any swelling.  You may take ibuprofen for inflammation which will also help with pain.  Take 3 tablets with food 3 times a day.  A prescription for hydrocodone was sent to your pharmacy.  This is for moderate to severe pain.
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
## 2020-08-20 IMAGING — US US MFM OB FOLLOW-UP
1 series · 12 of 28 positions shown · non-contrast
Comparison: none

PATIENT INFO:

PERFORMED BY:
RUDI
SERVICE(S) PROVIDED:
INDICATIONS:
28 weeks gestation of pregnancy
FETAL EVALUATION:
Num Of Fetuses:          1
Fetal Heart              133
Rate(bpm):
Presentation:            Breech
Placenta:                Posterior Grade , No previa
AFI Sum(cm)     %Tile       Largest Pocket(cm)
17.91           68
RUQ(cm)       RLQ(cm)       LUQ(cm)        LLQ(cm)
5.28
BIOMETRY:
BPD:      72.7  mm     G. Age:  29w 1d         67  %    CI:         78.47  %    70 - 86
FL/HC:       18.6  %    18.8 -
HC:      259.6  mm     G. Age:  28w 2d         19  %    HC/AC:       1.08       1.05 -
AC:      240.5  mm     G. Age:  28w 2d         44  %    FL/BPD:      66.6  %    71 - 87
FL:       48.4  mm     G. Age:  26w 2d        < 3  %    FL/AC:       20.1  %    20 - 24
Est. FW:    8843   g      2 lb 7 oz     22  %
m
GESTATIONAL AGE:
LMP:           28w 2d        Date:  11/26/17                 EDD:    09/02/18
U/S Today:     28w 0d                                        EDD:    09/04/18
Best:          28w 2d     Det. By:  LMP  (11/26/17)          EDD:    09/02/18
ANATOMY:
Cranium:               Seen on prior          Aortic Arch:            Normal appearance
Cavum:                 Seen on Prior          Ductal Arch:            Normal appearance
Ventricles:            Normal appearance      Diaphragm:              Within Normal
Limits
Lips:                  Normal appearance      Stomach:                Seen
Thoracic:              Within Normal          Abdomen:                Normal appearance
Heart:                 4-Chamber view         Kidneys:                Normal appearance
appears normal
RVOT:                  Normal appearance      Bladder:                Seen
LVOT:                  Normal appearance      Spine:                  Within Normal
CERVIX UTERUS ADNEXA:
Cervix
Length:           4.14  cm.

[Series 1: us mfm ob follow-up · 0.23mm/px · 12 of 48 slices shown]
[im 2/48]
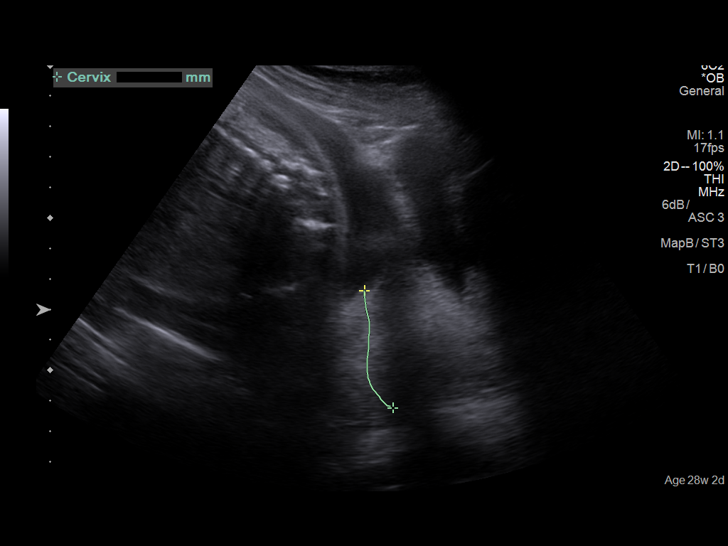
[im 6/48]
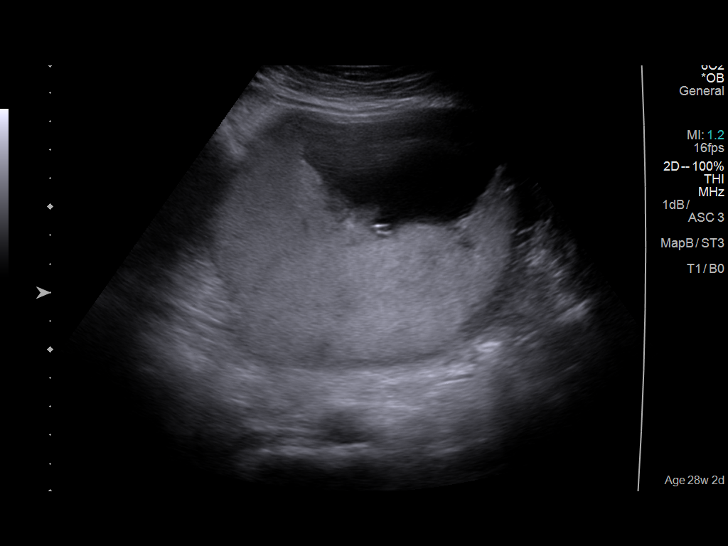
[im 9/48]
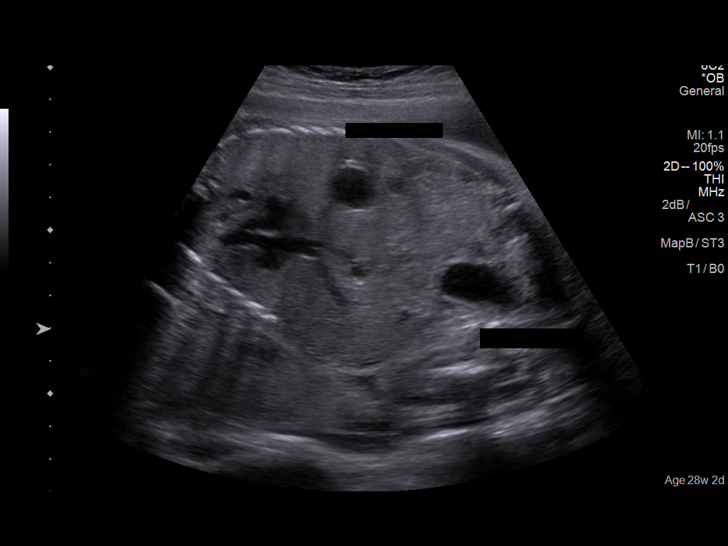
[im 14/48]
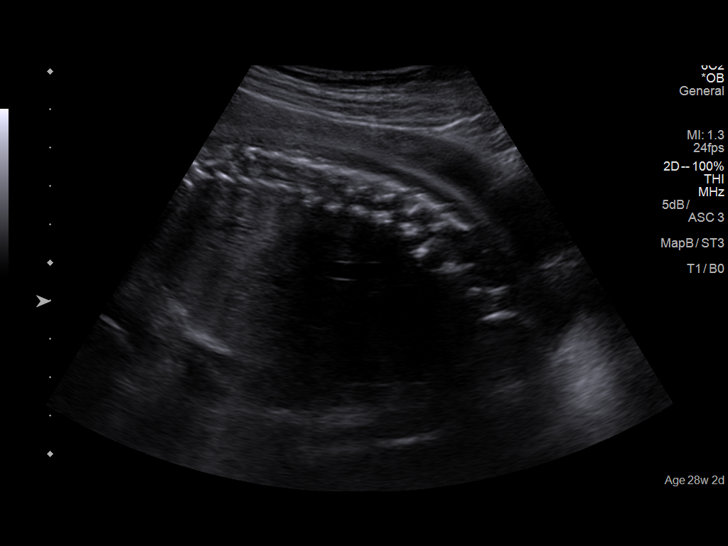
[im 18/48]
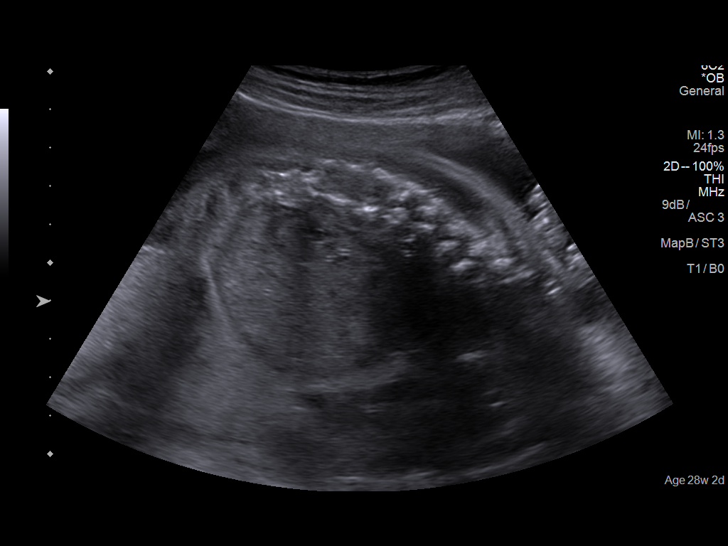
[im 21/48]
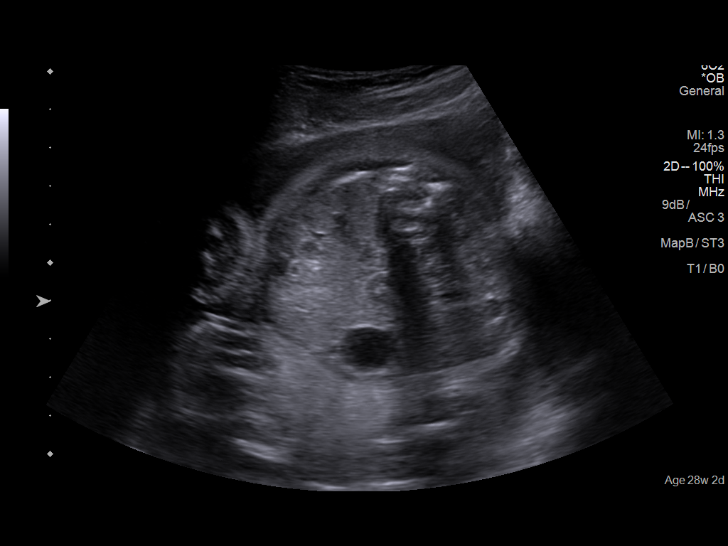
[im 27/48]
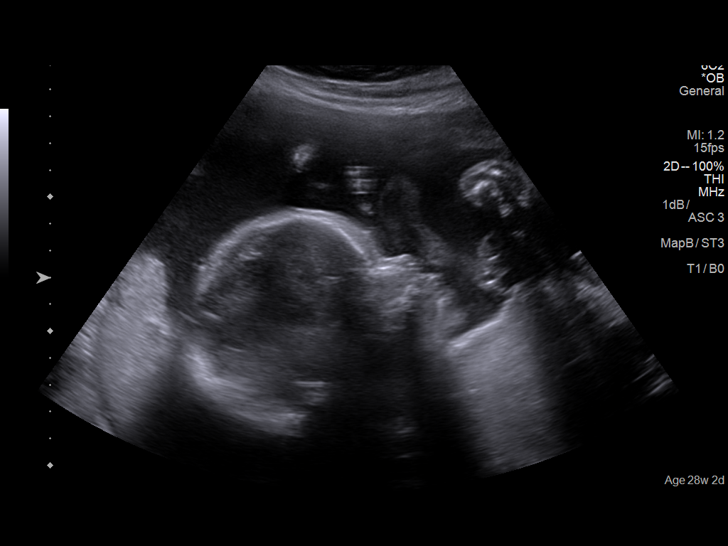
[im 30/48]
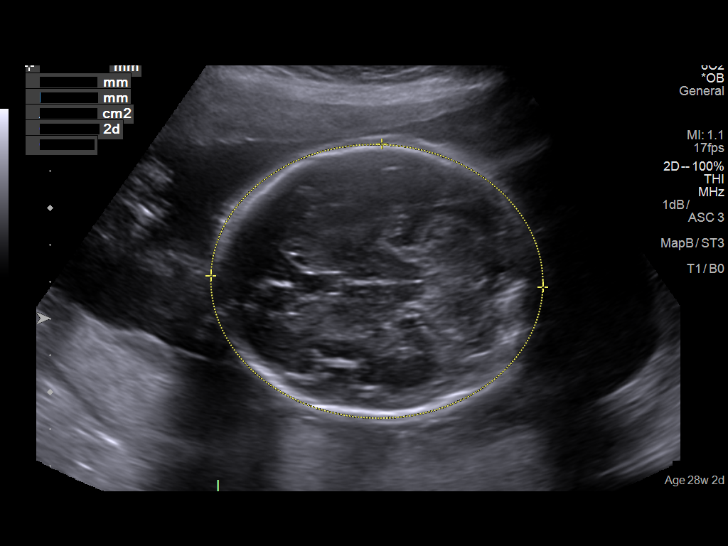
[im 34/48]
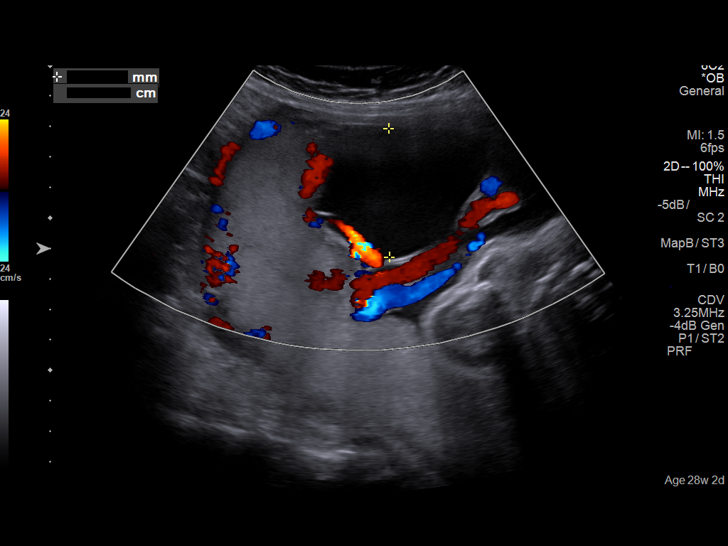
[im 39/48]
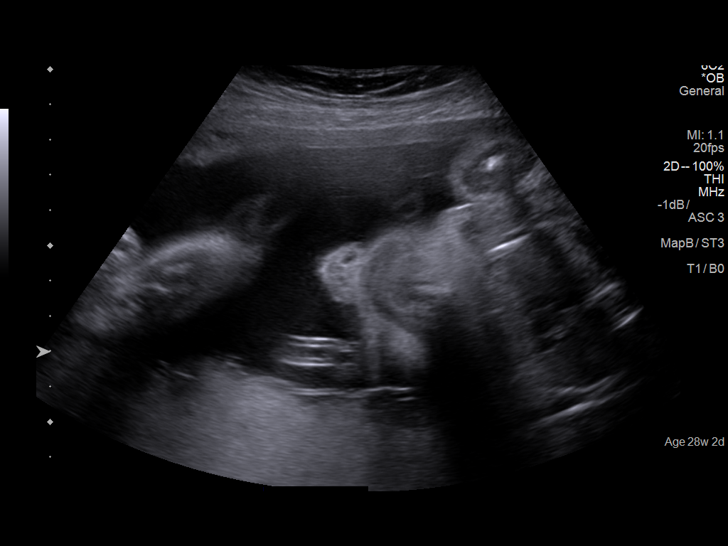
[im 42/48]
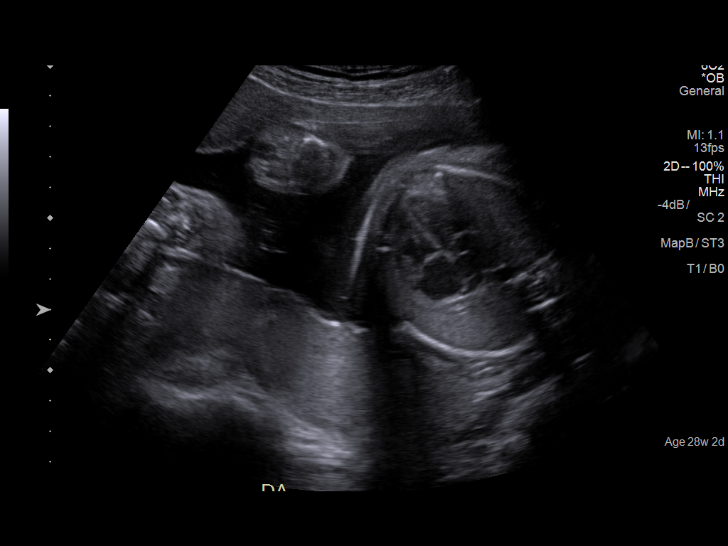
[im 46/48]
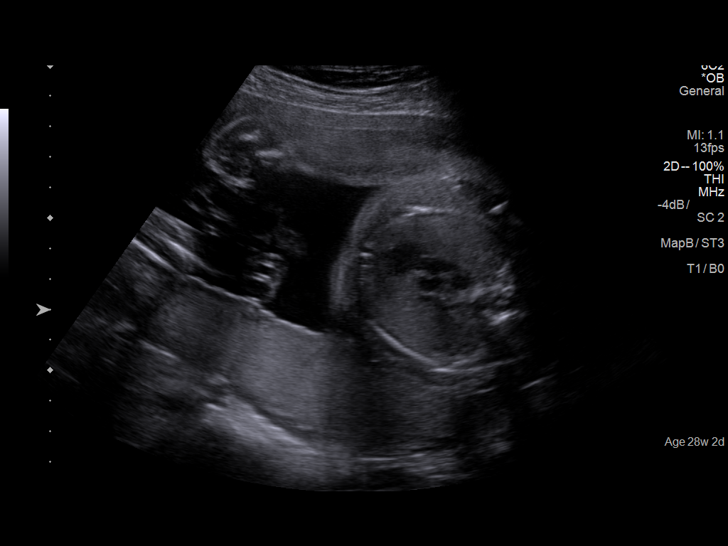

[12 of 28 positions shown; findings below may reference images not displayed]

IMPRESSION: Thank you for referring your patient for follow up fetal growth
due to fetal growth restriction. Ultrasound demonstrates a
single live fetus at 28 weeks 3 days. Dating is by MP
consistent with ultrasound performed at [facility] on
02/27/18; measurements were consistent with 13 weeks 1
day.
The estimated fetal weight is now above the 10th percentile
and appropriate for the gestational age. The amniotic fluid
volume is normal and active fetal movements are seen.
Recommend follow up growth in 4 weeks. Please schedule
where convenient.
Thank you for referring your patient for follow up fetal growth
due to fetal growth restriction. Ultrasound demonstrates a
single live fetus at 28 weeks 3 days. Dating is by MP
consistent with ultrasound performed at [facility] on
02/27/18; measurements were consistent with 13 weeks 1
day.
The estimated fetal weight is now above the 10th percentile
(efw at 22nd percentile today) and appropriate for the
gestational age. The amniotic fluid volume is normal and
active fetal movements are seen.
Recommend follow up growth in 4 weeks. Please schedule
where convenient.

## 2024-01-07 ENCOUNTER — Encounter: Payer: Self-pay | Admitting: Emergency Medicine

## 2024-01-07 ENCOUNTER — Ambulatory Visit: Admission: EM | Admit: 2024-01-07 | Discharge: 2024-01-07 | Disposition: A | Discharge status: Home / Self Care

## 2024-01-07 DIAGNOSIS — B850 Pediculosis due to Pediculus humanus capitis: Secondary | ICD-10-CM

## 2024-01-07 DIAGNOSIS — B009 Herpesviral infection, unspecified: Secondary | ICD-10-CM | POA: Diagnosis not present

## 2024-01-07 MED ORDER — PERMETHRIN 5 % EX CREA: TOPICAL_CREAM | CUTANEOUS | 1 refills | Status: AC

## 2024-01-07 MED ORDER — VALACYCLOVIR HCL 1 G PO TABS: 2000.0000 mg | ORAL_TABLET | Freq: Two times a day (BID) | ORAL | 0 refills | Status: AC
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
## 2024-01-07 NOTE — ED Provider Notes (Signed)
 [facility]    CSN: [id number] Arrival date & time: 01/07/24  1628      History   Chief Complaint Chief Complaint  Patient presents with   Rash   Mouth Lesions    HPI Kerri Wood is a 38 y.o. female.   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  She recently was released from prison.  She is concerned she may have scabies.  No history of HSV.  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 Authorizing Provider  apixaban (ELIQUIS) 5 MG TABS tablet Take 5 mg by mouth. 05/01/23 04/25/24 Yes [provider]  METHADONE HCL PO Take by mouth.   Yes [provider]  permethrin (ELIMITE) 5 % cream Apply to affected area once 01/07/24  Yes Kent Pear, NP  valACYclovir (VALTREX) 1000 MG tablet Take 2 tablets (2,000 mg total)  by mouth 2 (two) times daily. 01/07/24  Yes Kent Pear, NP  acetaminophen  (TYLENOL ) 500 MG tablet Take 2 tablets (1,000 mg total) by mouth every 6 (six) hours as needed for fever (for pain scale < 4). 08/31/18   McVey, Georges Kings, CNM  aspirin EC 81 MG tablet Take 81 mg by mouth.    [provider]  enoxaparin  (LOVENOX ) 40 MG/0.4ML injection Inject 0.4 mLs (40 mg total) into the skin every 12 (twelve) hours. 08/31/18 10/12/18  McVey, Georges Kings, CNM  HYDROcodone -acetaminophen  (NORCO/VICODIN) 5-325 MG tablet Take 1 tablet by mouth every 6 (six) hours as needed for moderate pain. 08/03/20   Stafford Eagles, PA-C  medroxyPROGESTERone  (DEPO-PROVERA ) 150 MG/ML injection Inject 1 mL (150 mg total) into the muscle every 3 (three) months. 10/27/18   McVey, Georges Kings, CNM  Prenatal Vit-Fe Fumarate-FA (PRENATAL MULTIVITAMIN) TABS tablet Take 1 tablet by mouth daily at 12 noon.    [provider]  senna-docusate (SENOKOT-S) 8.6-50 MG tablet Take 2 tablets by mouth daily. 09/01/18   McVey, Georges Kings, CNM    Family History Family History  Problem  Relation Age of Onset   Depression Mother    Depression Brother    Cancer Maternal Grandmother    Cancer Paternal Grandmother     Social History Social History   Tobacco Use   Smoking status: Every Day    Current packs/day: 0.00    Types: Cigarettes    Last attempt to quit: 08/27/2018    Years since quitting: 5.3   Smokeless tobacco: Former   Tobacco comments:    2 a day  Vaping Use   Vaping status: Former  Substance Use Topics   Alcohol use: Not Currently   Drug use: Not Currently    Comment: Cocaine and pills 2 years ago; went thru [facility]     Allergies   Morphine and codeine   Review of Systems Review of Systems  Skin:  Positive for rash.     Physical Exam Triage Vital Signs ED Triage Vitals [01/07/24 1652]  Encounter Vitals Group     BP      Systolic BP Percentile      Diastolic BP Percentile      Pulse       Resp      Temp      Temp src      SpO2      Weight      Height      Head Circumference      Peak Flow      Pain Score 3     Pain Loc      Pain Education      Exclude from Growth Chart    No data found.  Updated Vital Signs BP 109/67 (BP Location: Left Arm)   Pulse 79   Temp 98.3 F (36.8 C) (Oral)   Resp 18   LMP 01/06/2024   SpO2 96%   Visual Acuity Right Eye Distance:   Left Eye Distance:   Bilateral Distance:    Right Eye Near:   Left Eye Near:    Bilateral Near:     Physical Exam Vitals and nursing note reviewed.  Constitutional:      Appearance: Normal appearance. She is not ill-appearing.  HENT:     Head: Normocephalic and atraumatic.  Skin:    General: Skin is warm and dry.     Capillary Refill: Capillary refill takes less than 2 seconds.     Findings: Erythema and rash present.  Neurological:     General: No focal deficit present.     Mental Status: She is alert and oriented to person, place, and time.      UC Treatments / Results  Labs (all labs ordered are listed, but only abnormal results are displayed) Labs Reviewed - No data to display  EKG   Radiology No results found.  Procedures Procedures (including critical care time)  Medications Ordered in UC Medications - No data to display  Initial Impression / Assessment and Plan / UC Course  I have reviewed the triage vital signs and the nursing notes.  Pertinent labs & imaging results that were available during my care of the patient were reviewed by me and considered in my medical decision making (see chart for details).   Patient is a pleasant, nontoxic-appearing 38 year old female presenting for evaluation of scalp and neck rash and cold sores around her mouth that have both been present for the last 2 days.  She was recently in prison and was released.  While in prison, some of the women in her dorm  were experiencing rashes under the arms that they thought might be fungal.  They  were treated with a powder.  The patient reports that her lesions have not resolved.  Though it is difficult to see on the image above, the patient does not have any visible scalp rash though she does have white flakes that are attached to some of the shafts of hair.  There are some dandruff flakes present but the other white particles attached to the hair appear to be immature lice.  No mature lice noted on the scalp.  As far as the lesions are on the patient's mouth she has some circumorally but no intraoral lesions.  She denies any history of HSV or cold sores.  However, the lip lesions do appear to be HSV 1.  I will discharge permethrin to treat the lice and have her apply the lotion to wash hair, leave in for 10 minutes, and then rinse and comb out the nits and eggs.  She can repeat in 7 days if still present.  For the cold sores I will discharge her home on Valtrex and have her take 2000 mg twice daily for 1 day to help with her oral HSV.   Final Clinical Impressions(s) / UC Diagnoses   Final diagnoses:  Lice infested hair  HSV-1 (herpes simplex virus 1) infection     Discharge Instructions      For your treatment of lice I want you to apply the permethrin cream to wash hair, leave it in place for 10 minutes, rinse out, and then use a lice comb to comb out the nits and eggs.  This would be most helpful if you have another person to assist you.  With regards to your cold sores I would take 2000 mg of Valtrex twice daily for 1 day for treatment of your cold sores.  If your symptoms not improving, or new symptoms develop, return for reevaluation.   ED Prescriptions     Medication Sig Dispense Auth. Provider   permethrin (ELIMITE) 5 % cream Apply to affected area once 60 g Kent Pear, NP   valACYclovir (VALTREX) 1000 MG tablet Take 2 tablets (2,000 mg total) by mouth 2 (two) times daily. 30 tablet Kent Pear, NP      PDMP not reviewed this encounter.   Kent Pear,  NP 01/07/24 1711

## 2024-01-07 NOTE — Discharge Instructions (Addendum)
 For your treatment of lice I want you to apply the permethrin cream to wash hair, leave it in place for 10 minutes, rinse out, and then use a lice comb to comb out the nits and eggs.  This would be most helpful if you have another person to assist you.  With regards to your cold sores I would take 2000 mg of Valtrex twice daily for 1 day for treatment of your cold sores.  If your symptoms not improving, or new symptoms develop, return for reevaluation.
